# Patient Record
Sex: Female | Born: 1980
Health system: Southern US, Community
[De-identification: ages and names within clinical notes are randomized; demographics above are authoritative.]

## PROBLEM LIST (undated history)

## (undated) DIAGNOSIS — Z789 Other specified health status: Secondary | ICD-10-CM

## (undated) DIAGNOSIS — K5792 Diverticulitis of intestine, part unspecified, without perforation or abscess without bleeding: Secondary | ICD-10-CM

## (undated) DIAGNOSIS — N6009 Solitary cyst of unspecified breast: Secondary | ICD-10-CM

## (undated) HISTORY — PX: WISDOM TOOTH EXTRACTION: SHX21

## (undated) HISTORY — PX: CERVICAL CERCLAGE: SHX1329

## (undated) HISTORY — DX: Solitary cyst of unspecified breast: N60.09

## (undated) HISTORY — DX: Other specified health status: Z78.9

---

## 2001-06-05 ENCOUNTER — Emergency Department (HOSPITAL_COMMUNITY): Admission: EM | Admit: 2001-06-05 | Discharge: 2001-06-05 | Payer: Self-pay | Admitting: *Deleted

## 2002-04-18 ENCOUNTER — Encounter: Payer: Self-pay | Admitting: *Deleted

## 2002-04-18 ENCOUNTER — Inpatient Hospital Stay (HOSPITAL_COMMUNITY): Admission: AD | Admit: 2002-04-18 | Discharge: 2002-04-22 | Payer: Self-pay | Admitting: *Deleted

## 2002-04-21 ENCOUNTER — Encounter (INDEPENDENT_AMBULATORY_CARE_PROVIDER_SITE_OTHER): Payer: Self-pay | Admitting: Specialist

## 2002-05-16 ENCOUNTER — Encounter (INDEPENDENT_AMBULATORY_CARE_PROVIDER_SITE_OTHER): Payer: Self-pay

## 2002-05-16 ENCOUNTER — Inpatient Hospital Stay (HOSPITAL_COMMUNITY): Admission: AD | Admit: 2002-05-16 | Discharge: 2002-05-16 | Payer: Self-pay | Admitting: *Deleted

## 2005-08-26 ENCOUNTER — Other Ambulatory Visit: Admission: RE | Admit: 2005-08-26 | Discharge: 2005-08-26 | Payer: Self-pay | Admitting: Obstetrics & Gynecology

## 2008-08-09 ENCOUNTER — Inpatient Hospital Stay (HOSPITAL_COMMUNITY): Admission: AD | Admit: 2008-08-09 | Discharge: 2008-08-10 | Payer: Self-pay | Admitting: Obstetrics and Gynecology

## 2008-08-10 IMAGING — US US OB COMP LESS 14 WK
1 series · 13 of 28 positions shown · non-contrast
Comparison: none

CLINICAL DATA: Early pregnancy, bleeding, pain

OBSTETRIC <14 WK US AND TRANSVAGINAL OB US
TECHNIQUE: Both transabdominal and transvaginal ultrasound
examinations were performed for complete evaluation of the
gestation as well as the maternal uterus, adnexal regions, and
pelvic cul-de-sac.

[Series 1: us ob comp less 14 wks · 13 of 33 slices shown]
[im 2/33]
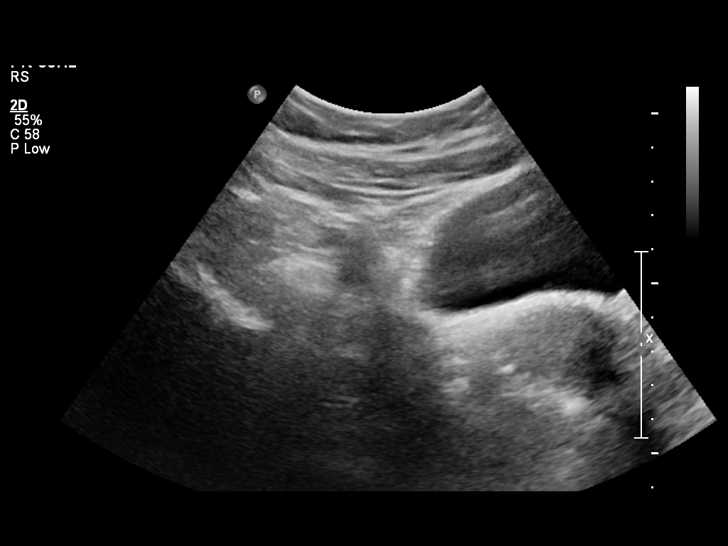
[im 4/33]
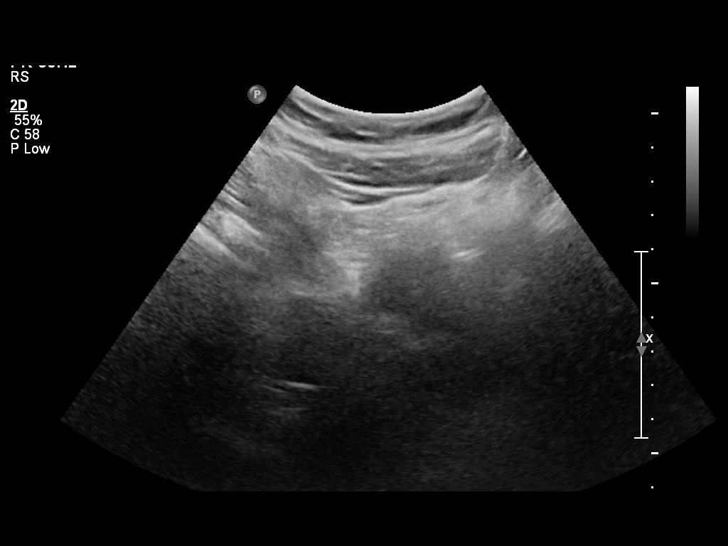
[im 6/33]
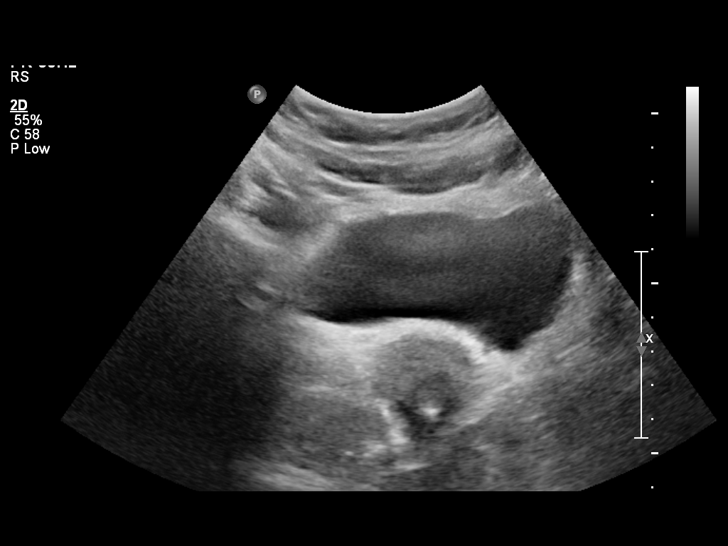
[im 9/33]
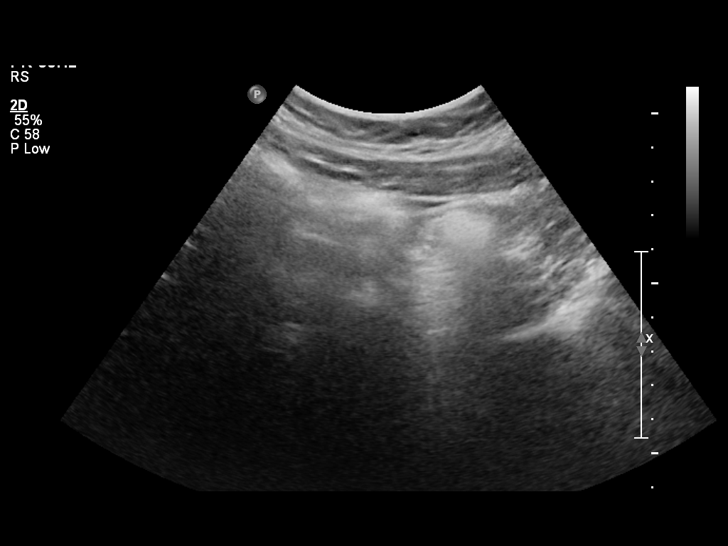
[im 11/33]
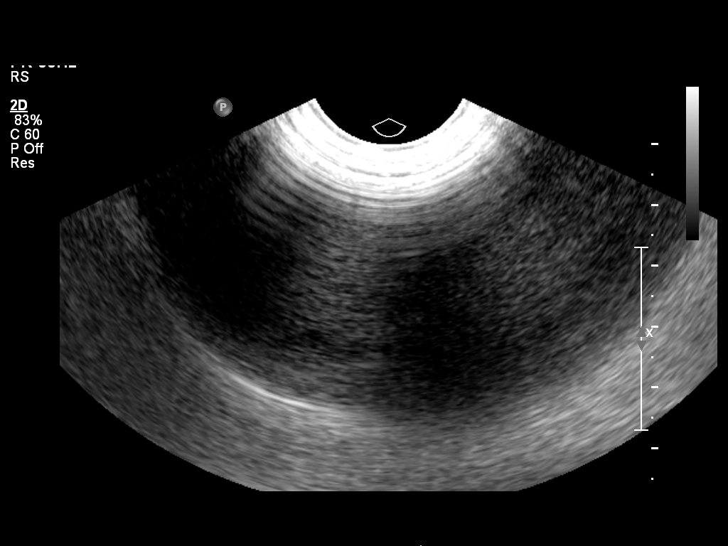
[im 14/33]
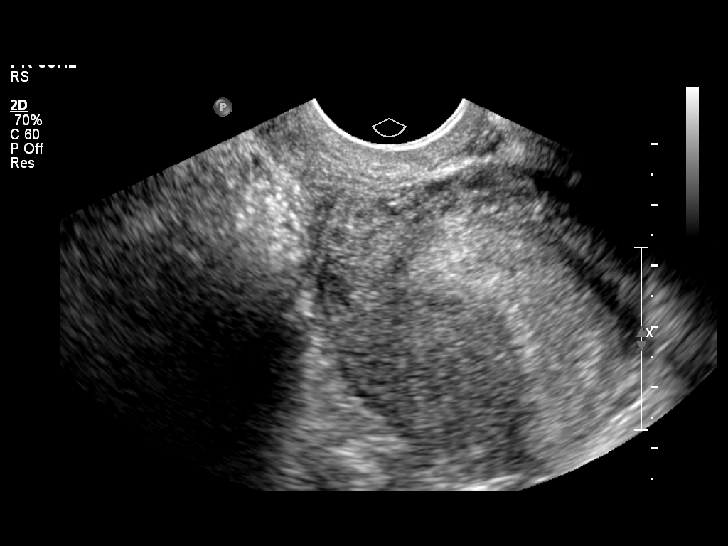
[im 17/33]
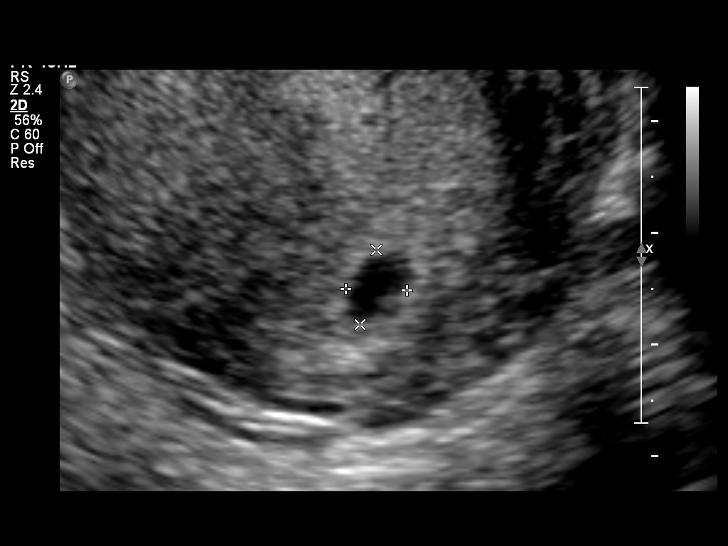
[im 19/33]
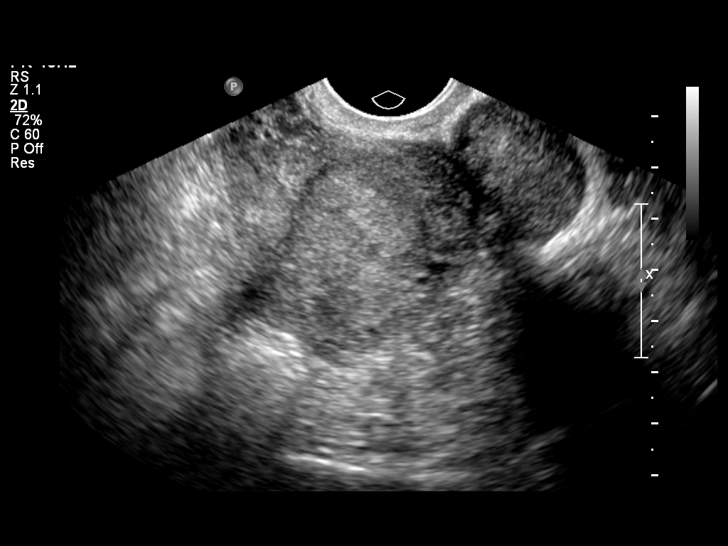
[im 22/33]
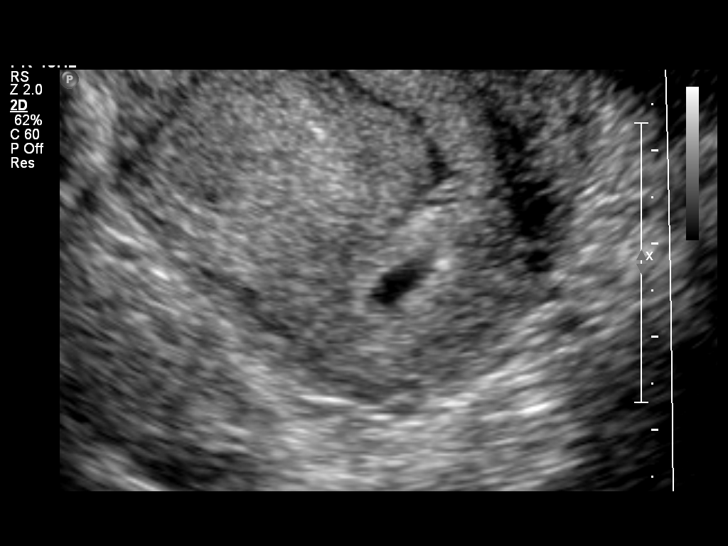
[im 24/33]
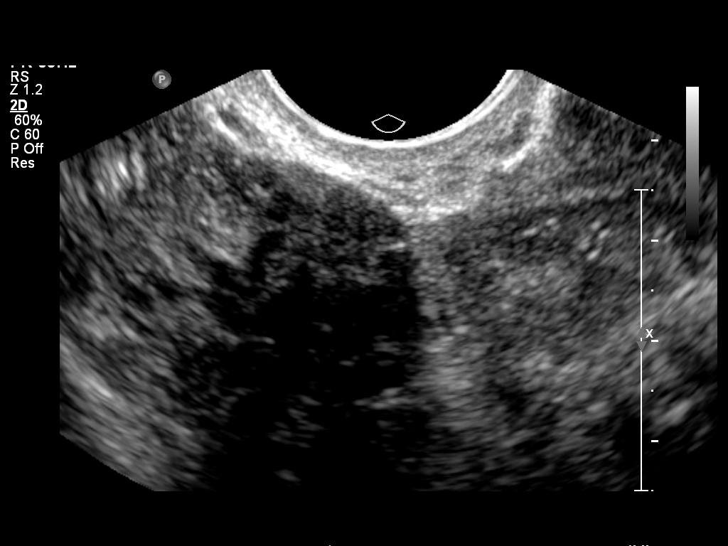
[im 27/33]
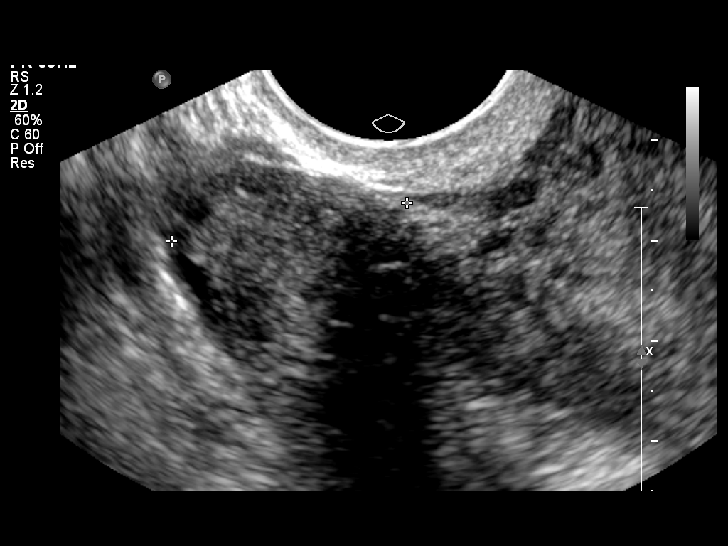
[im 29/33]
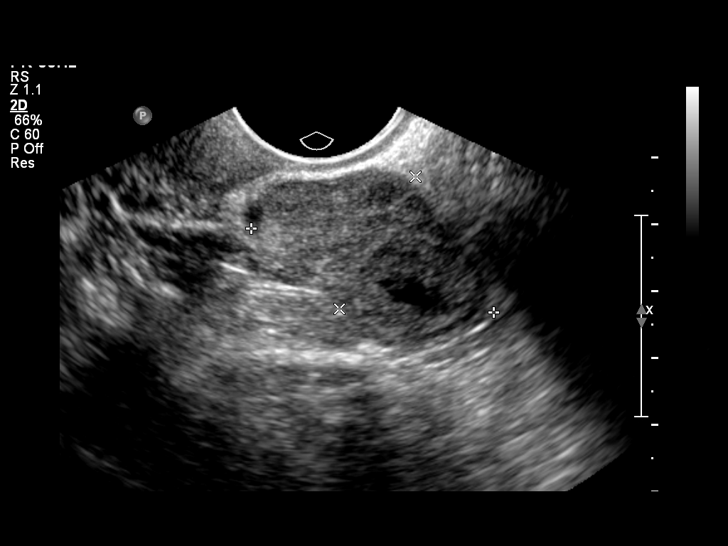
[im 31/33]
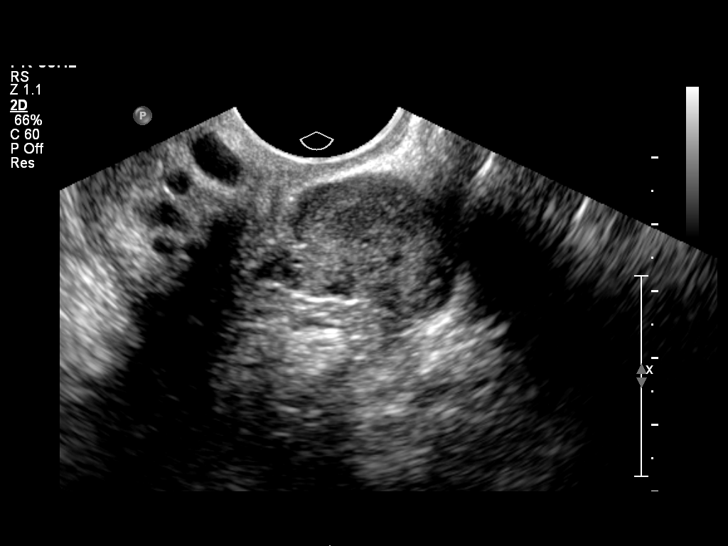

[13 of 28 positions shown; findings below may reference images not displayed]

No prior exams for comparison
Quantitative beta HCG [2A]

Intrauterine gestational sac: Present
Yolk sac: Not identified
Embryo: Not identified
Cardiac Activity: Not identified
Heart Rate: N/A bpm

MSD: 5.7 mm  mm  5    w 1    d
CRL:               w     d          US EDC:

Maternal uterus/adnexae:
No subchorionic hemorrhage.
Tiny sac within uterus without additional uterine mass.
No free pelvic fluid.
Left ovary 3.8 x 2.3 x 2.4 cm, containing corpus luteal cyst.
Right ovary normal size and morphology, 2.7 x 2.2 x 2.4 cm.
No adnexal masses.
IMPRESSION: Apparent early gestational sac within uterus, sac diameter
corresponding to 5 weeks 1 day EGA.
No definite fetal pole or yolk sac are visualized, though would
expect visualization with the observed quantitative beta HCG level.
No ectopic pregnancy or associated signs identified.
Serial quantitative beta HCG and potentially follow-up ultrasound
recommended to confirm this represents a viable early intrauterine
pregnancy and to definitively exclude ectopic pregnancy.

## 2008-08-14 ENCOUNTER — Inpatient Hospital Stay (HOSPITAL_COMMUNITY): Admission: RE | Admit: 2008-08-14 | Discharge: 2008-08-14 | Payer: Self-pay | Admitting: Obstetrics and Gynecology

## 2008-08-14 IMAGING — US US OB TRANSVAGINAL
1 series · 14 of 19 positions shown · non-contrast
Comparison: none

OBSTETRICAL ULTRASOUND:
 This ultrasound exam was performed in the [HOSPITAL] Ultrasound Department.  The OB US report was generated in the AS system, and faxed to the ordering physician.  This report is also available in [REDACTED] PACS.

[Series 1: us ob transvaginal · 14 of 19 slices shown]
[im 1/19]
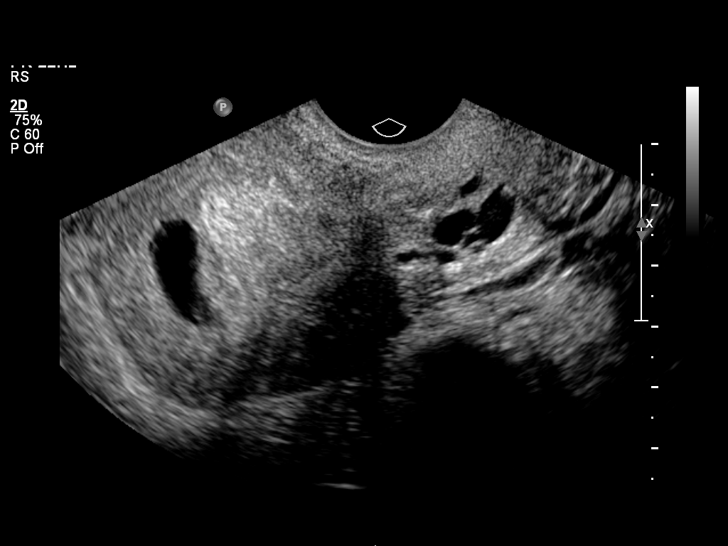
[im 3/19]
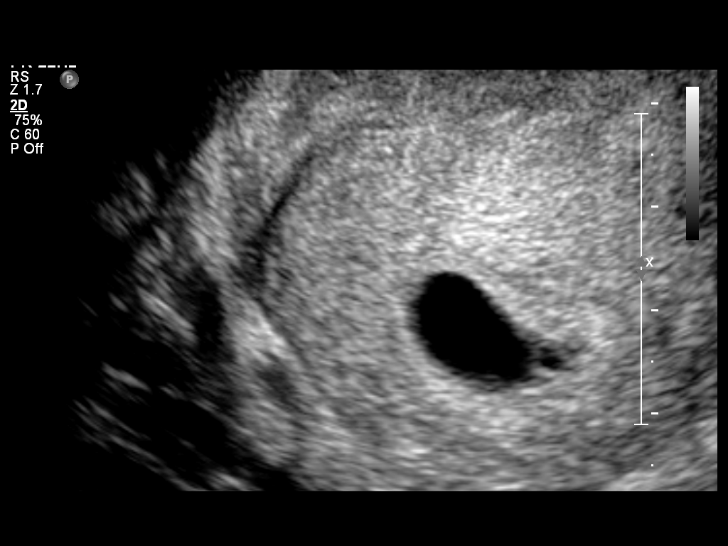
[im 4/19]
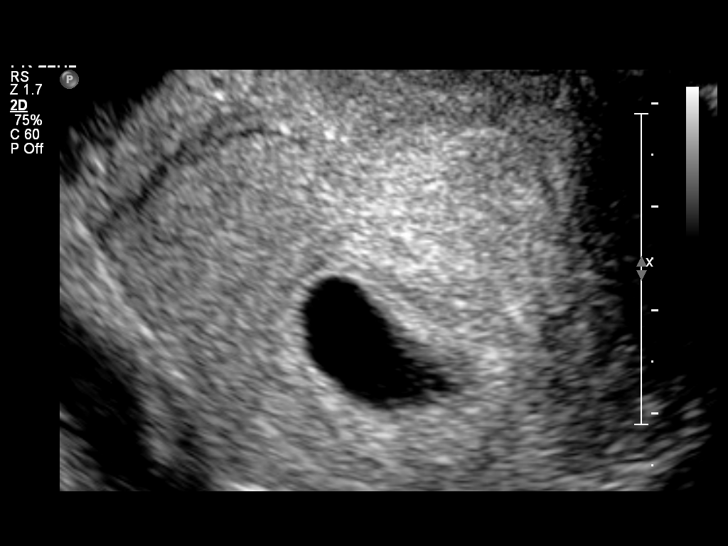
[im 5/19]
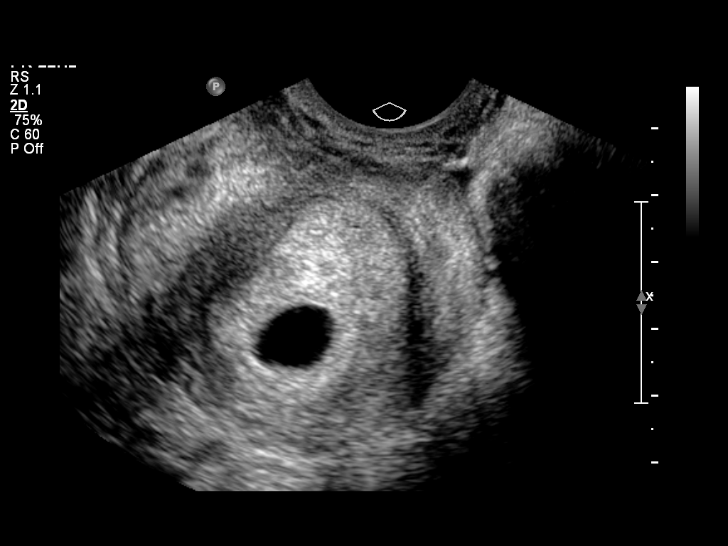
[im 7/19]
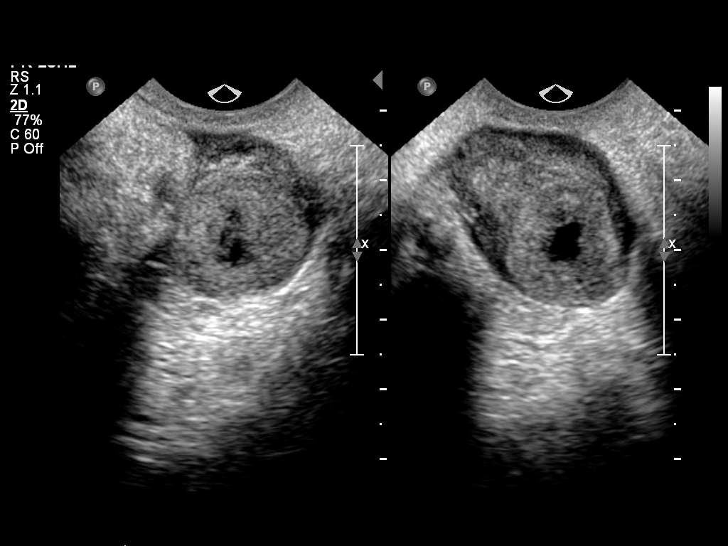
[im 8/19]
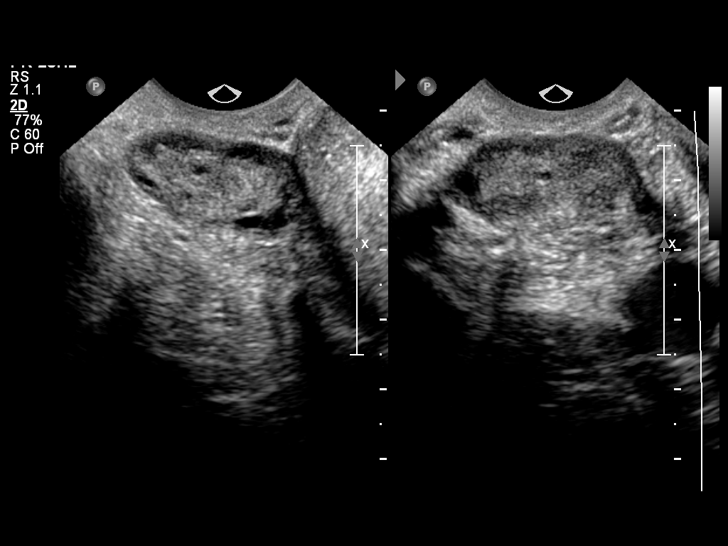
[im 9/19]
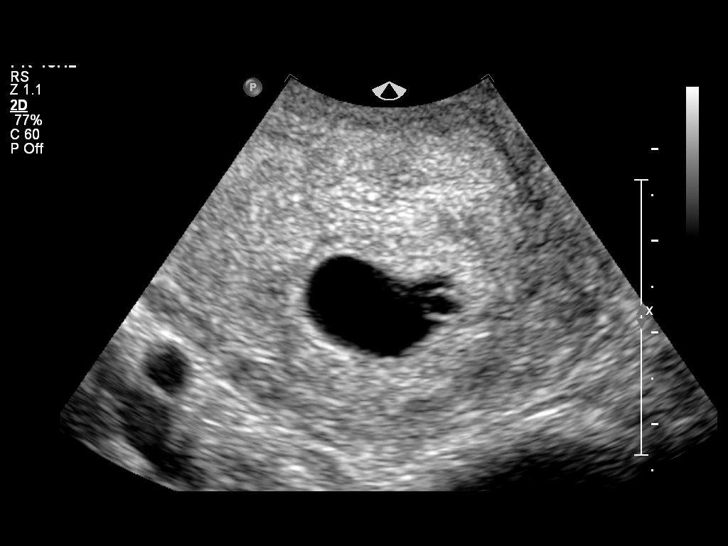
[im 11/19]
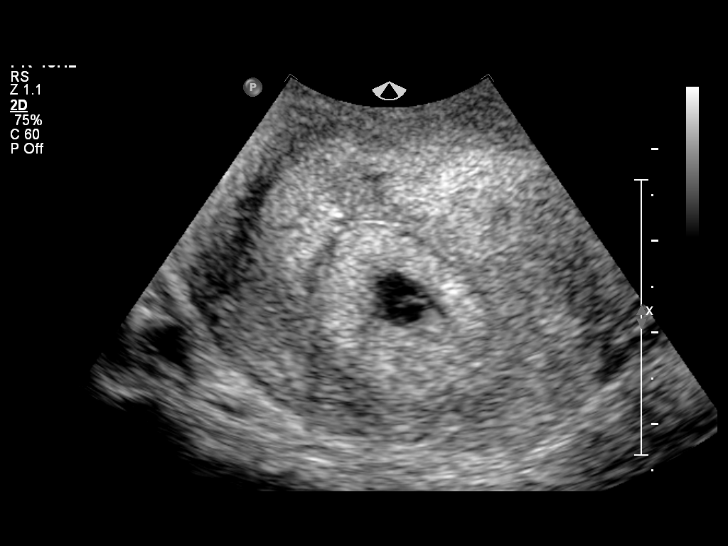
[im 12/19]
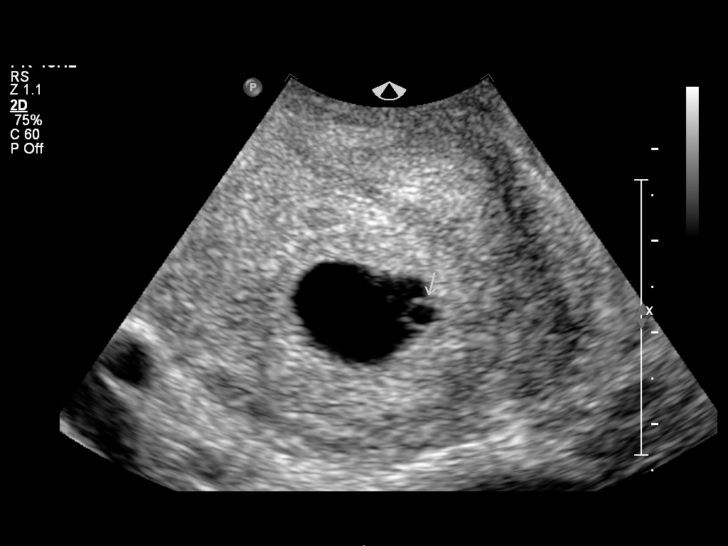
[im 13/19]
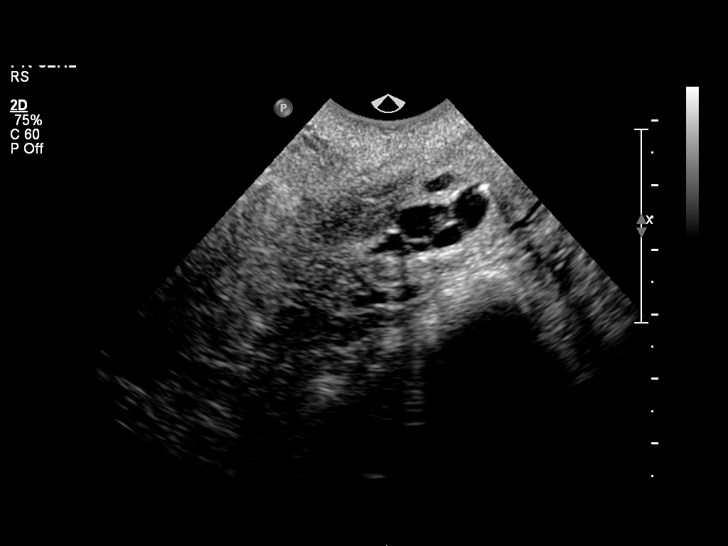
[im 15/19]
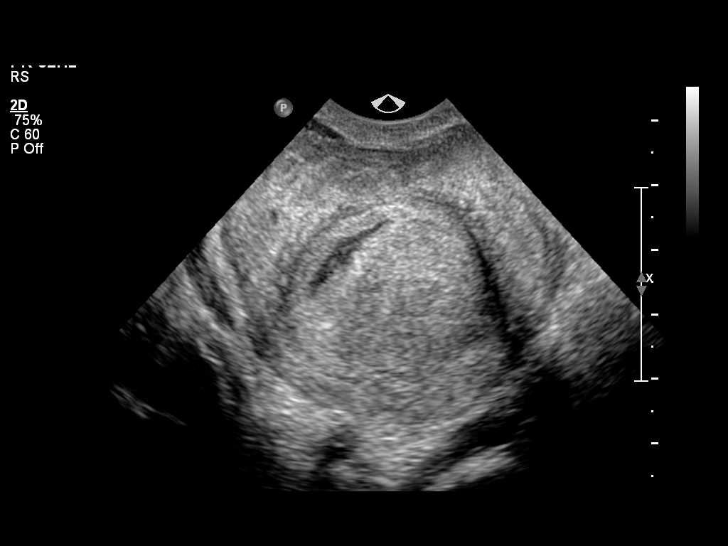
[im 16/19]
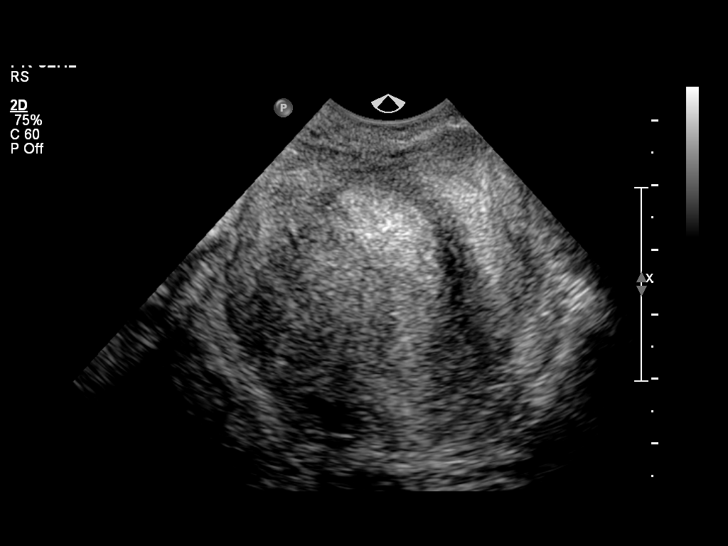
[im 17/19]
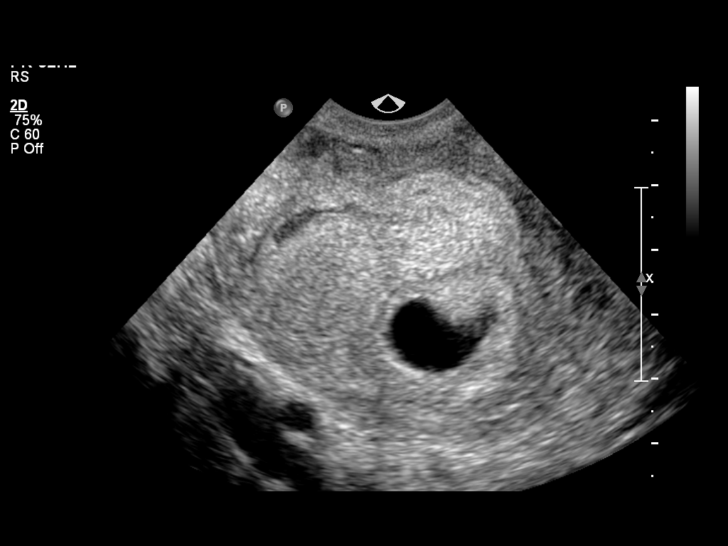
[im 19/19]
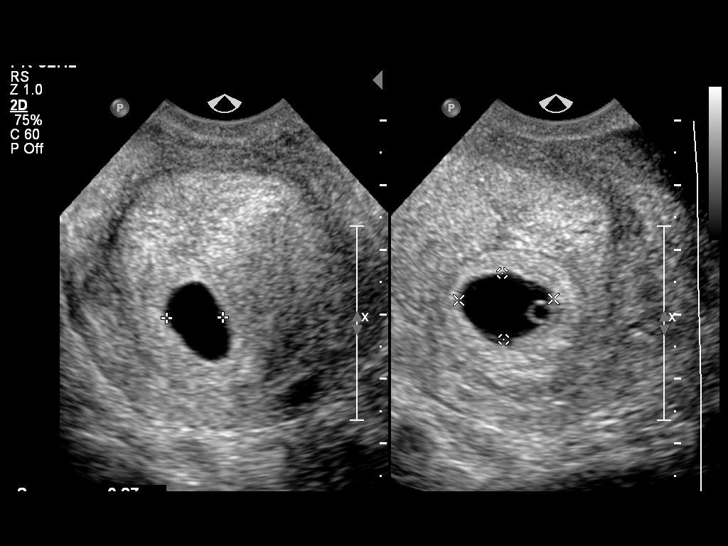

[14 of 19 positions shown; findings below may reference images not displayed]

IMPRESSION: See AS Obstetric US report.

## 2008-10-26 ENCOUNTER — Inpatient Hospital Stay (HOSPITAL_COMMUNITY): Admission: AD | Admit: 2008-10-26 | Discharge: 2008-10-26 | Payer: Self-pay | Admitting: Obstetrics and Gynecology

## 2008-10-26 IMAGING — US US OB COMP +14 WK
1 series · 14 of 28 positions shown · non-contrast
Comparison: none

OBSTETRICAL ULTRASOUND:
 This ultrasound exam was performed in the [HOSPITAL] Ultrasound Department.  The OB US report was generated in the AS system, and faxed to the ordering physician.  This report is also available in [REDACTED] PACS.

[Series 1: us ob comp +14 wk · 14 of 40 slices shown]
[im 2/40]
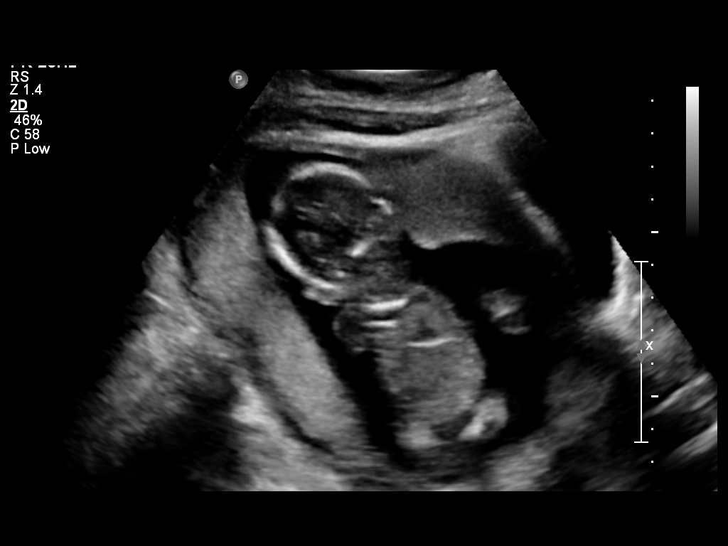
[im 5/40]
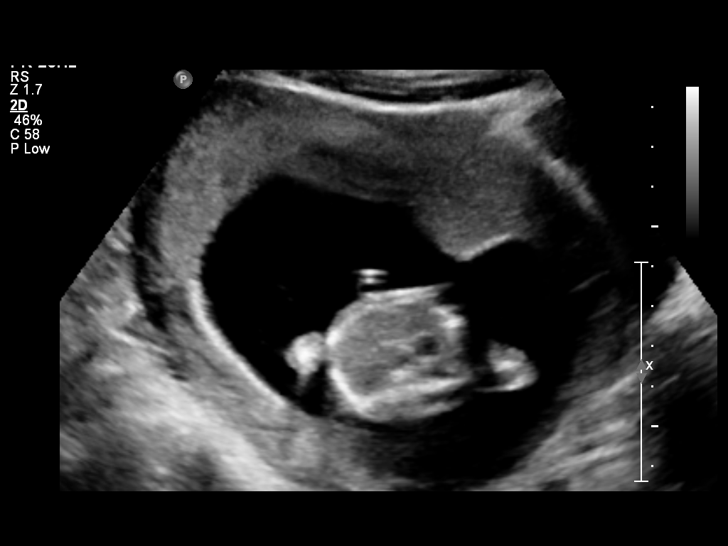
[im 8/40]
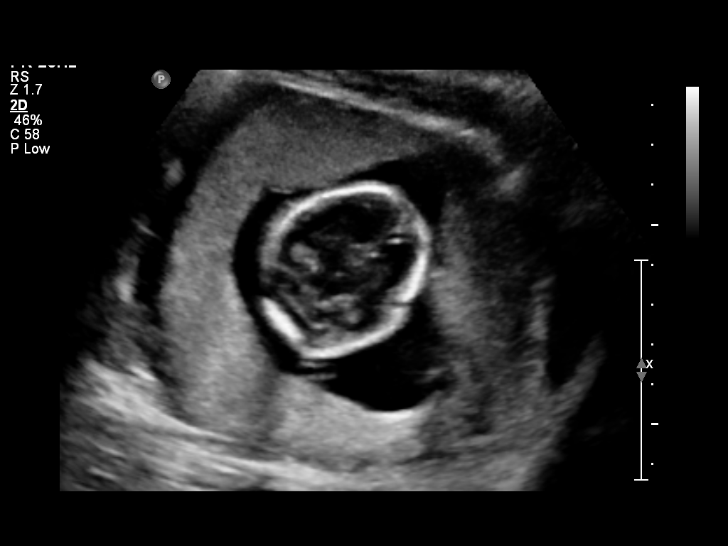
[im 11/40]
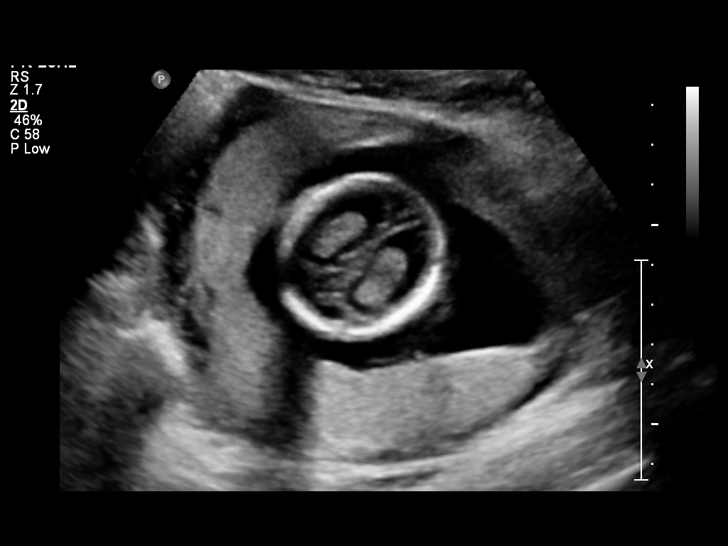
[im 14/40]
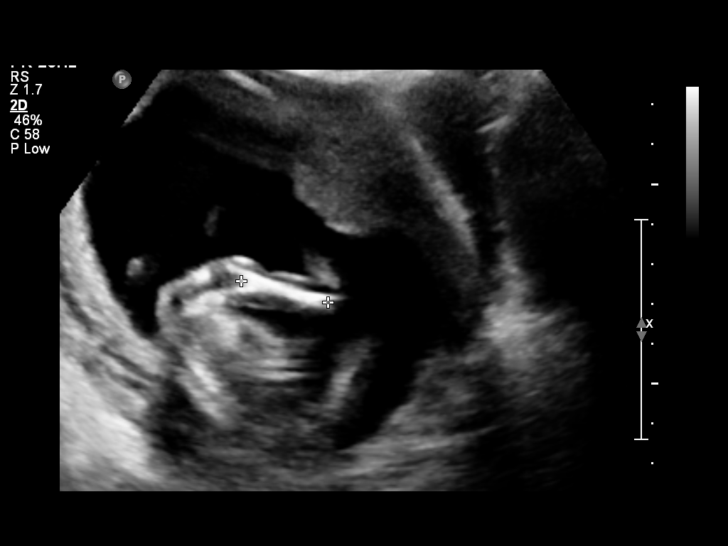
[im 16/40]
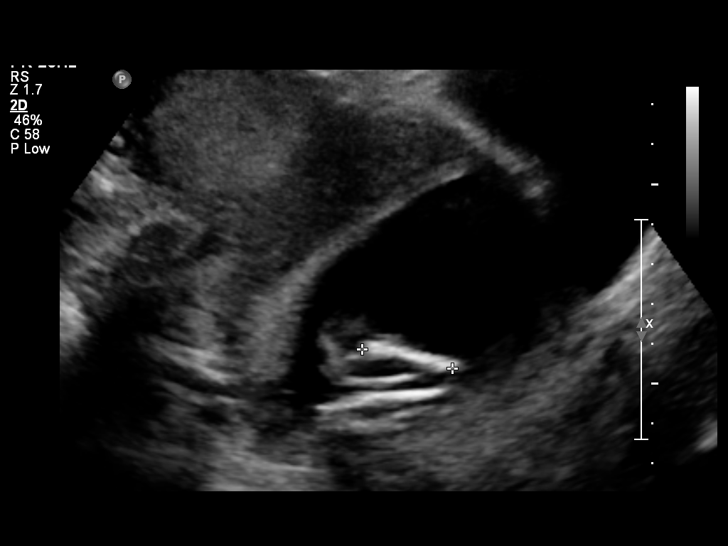
[im 19/40]
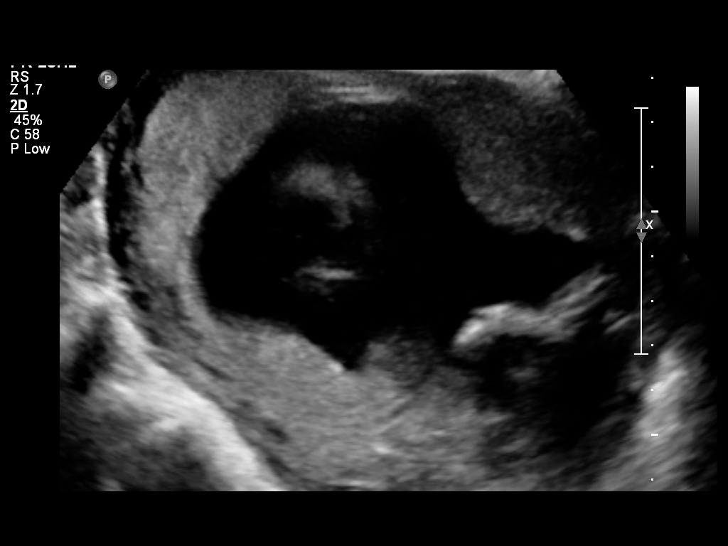
[im 22/40]
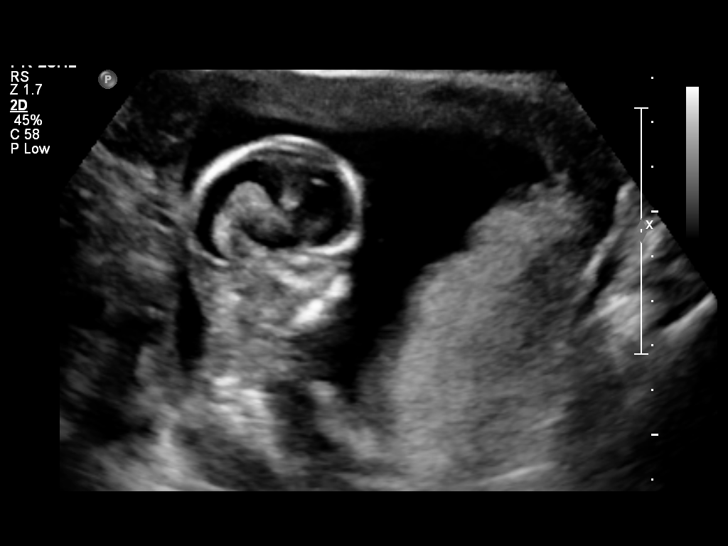
[im 25/40]
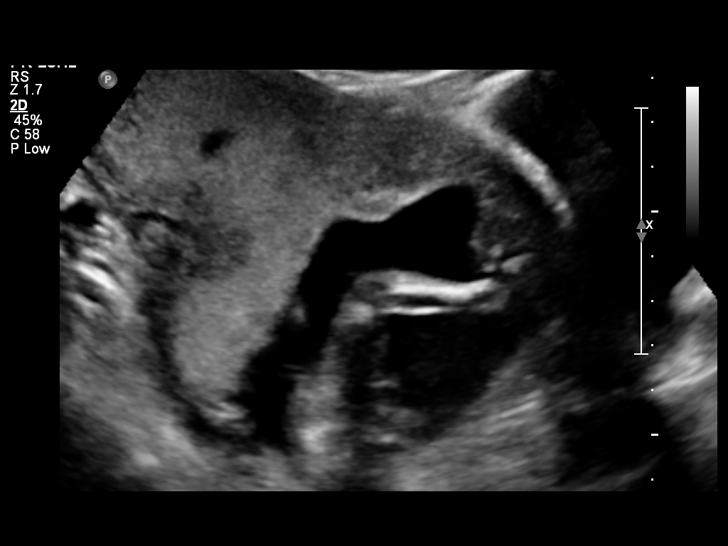
[im 28/40]
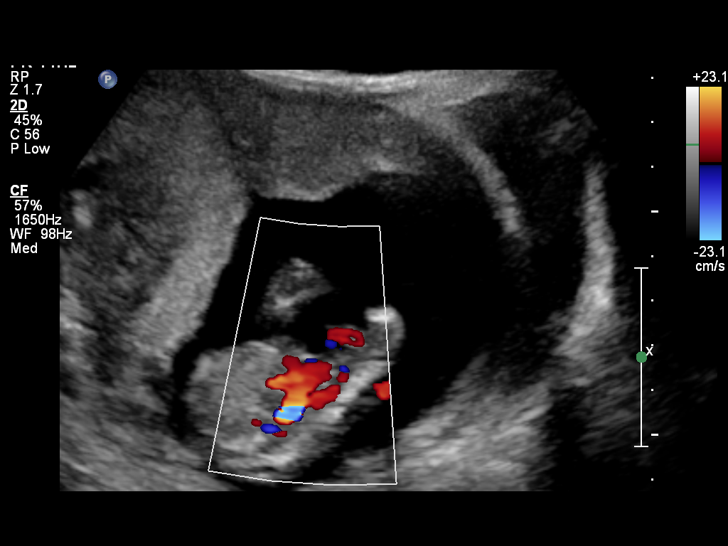
[im 31/40]
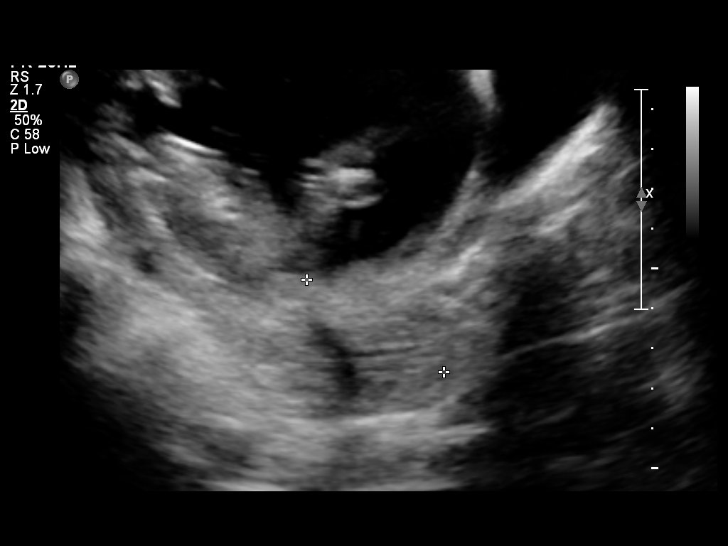
[im 34/40]
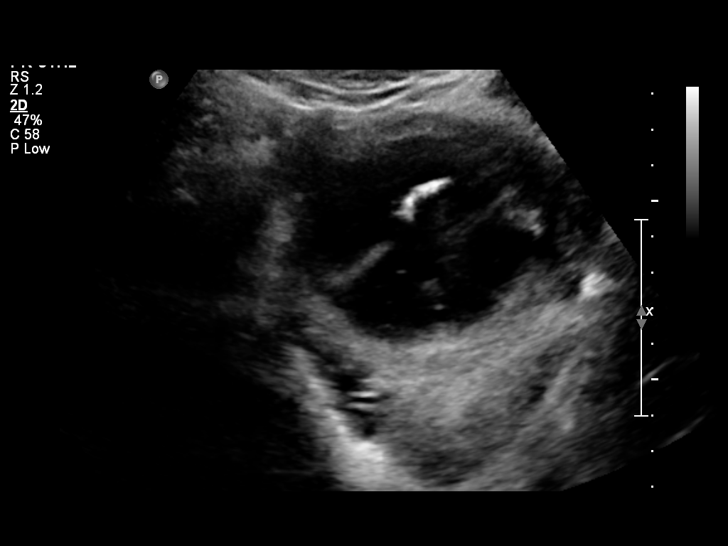
[im 37/40]
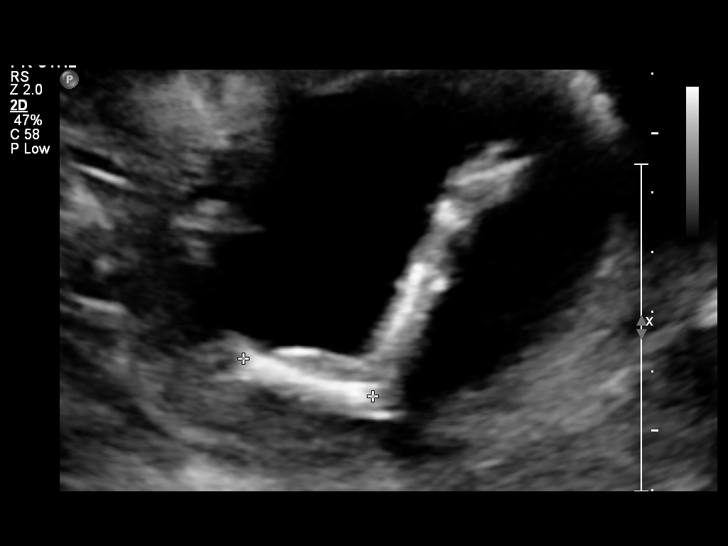
[im 40/40]
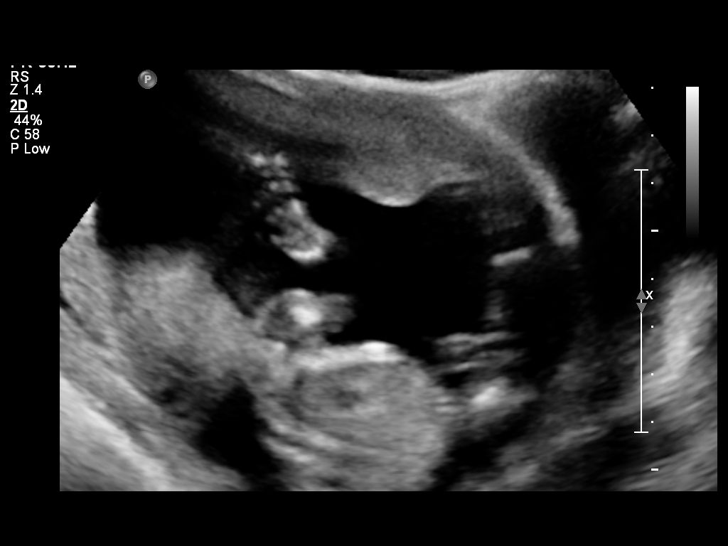

[14 of 28 positions shown; findings below may reference images not displayed]

IMPRESSION: See AS Obstetric US report.

## 2008-12-31 ENCOUNTER — Inpatient Hospital Stay (HOSPITAL_COMMUNITY): Admission: AD | Admit: 2008-12-31 | Discharge: 2008-12-31 | Payer: Self-pay | Admitting: Obstetrics and Gynecology

## 2009-02-05 ENCOUNTER — Inpatient Hospital Stay (HOSPITAL_COMMUNITY): Admission: AD | Admit: 2009-02-05 | Discharge: 2009-02-05 | Payer: Self-pay | Admitting: Obstetrics & Gynecology

## 2009-03-30 ENCOUNTER — Inpatient Hospital Stay (HOSPITAL_COMMUNITY): Admission: AD | Admit: 2009-03-30 | Discharge: 2009-04-01 | Payer: Self-pay | Admitting: Obstetrics and Gynecology

## 2009-04-03 ENCOUNTER — Ambulatory Visit: Admission: RE | Admit: 2009-04-03 | Discharge: 2009-04-03 | Payer: Self-pay | Admitting: Obstetrics and Gynecology

## 2009-10-15 ENCOUNTER — Ambulatory Visit: Payer: Self-pay | Admitting: Family

## 2009-10-15 DIAGNOSIS — R519 Headache, unspecified: Secondary | ICD-10-CM | POA: Insufficient documentation

## 2009-10-15 DIAGNOSIS — Z8669 Personal history of other diseases of the nervous system and sense organs: Secondary | ICD-10-CM | POA: Insufficient documentation

## 2009-10-15 DIAGNOSIS — K5289 Other specified noninfective gastroenteritis and colitis: Secondary | ICD-10-CM | POA: Insufficient documentation

## 2009-10-15 DIAGNOSIS — R51 Headache: Secondary | ICD-10-CM | POA: Insufficient documentation

## 2009-10-15 DIAGNOSIS — Z8719 Personal history of other diseases of the digestive system: Secondary | ICD-10-CM | POA: Insufficient documentation

## 2009-10-30 ENCOUNTER — Ambulatory Visit: Payer: Self-pay | Admitting: Family

## 2009-10-30 DIAGNOSIS — E669 Obesity, unspecified: Secondary | ICD-10-CM | POA: Insufficient documentation

## 2009-10-30 DIAGNOSIS — J329 Chronic sinusitis, unspecified: Secondary | ICD-10-CM | POA: Insufficient documentation

## 2009-10-30 LAB — CONVERTED CEMR LAB
ALT: 16 units/L (ref 0–35)
AST: 12 units/L (ref 0–37)
Alkaline Phosphatase: 134 units/L — ABNORMAL HIGH (ref 39–117)
Basophils Relative: 0 % (ref 0–1)
CO2: 24 meq/L (ref 19–32)
Cholesterol: 149 mg/dL (ref 0–200)
LDL Cholesterol: 86 mg/dL (ref 0–99)
MCHC: 32.9 g/dL (ref 30.0–36.0)
Monocytes Relative: 6 % (ref 3–12)
Neutro Abs: 4.4 10*3/uL (ref 1.7–7.7)
Neutrophils Relative %: 54 % (ref 43–77)
Platelets: 307 10*3/uL (ref 150–400)
RBC: 4.54 M/uL (ref 3.87–5.11)
Sodium: 139 meq/L (ref 135–145)
TSH: 0.608 microintl units/mL (ref 0.350–4.500)
Total Bilirubin: 0.8 mg/dL (ref 0.3–1.2)
Total CHOL/HDL Ratio: 2.9
Total Protein: 7.8 g/dL (ref 6.0–8.3)
VLDL: 12 mg/dL (ref 0–40)
WBC: 8.2 10*3/uL (ref 4.0–10.5)

## 2009-11-03 ENCOUNTER — Encounter: Payer: Self-pay | Admitting: Family

## 2010-01-01 ENCOUNTER — Encounter: Payer: Self-pay | Admitting: Family

## 2010-05-16 ENCOUNTER — Ambulatory Visit (HOSPITAL_COMMUNITY): Admission: RE | Admit: 2010-05-16 | Discharge: 2010-05-16 | Payer: Self-pay | Admitting: Obstetrics and Gynecology

## 2010-05-20 DEATH — deceased

## 2010-08-10 ENCOUNTER — Encounter: Payer: Self-pay | Admitting: Obstetrics and Gynecology

## 2010-08-19 NOTE — Assessment & Plan Note (Signed)
Summary: cpx/kdc   Vital Signs:  Patient profile:   30 year old female Height:      67 inches Weight:      194.50 pounds BMI:     30.57 Temp:     98.2 degrees F oral Pulse rate:   78 / minute Pulse rhythm:   regular Resp:     16 per minute BP sitting:   102 / 64  (right arm) Cuff size:   large  Vitals Entered By: Mervin Kung CMA (October 30, 2009 11:11 AM) CC: room 5  Pt is here for complete physical. Is Patient Diabetic? No   CC:  room 5  Pt is here for complete physical..  History of Present Illness: [name] is here for a complete physical exam. Patient reports regular exercise (Zumba) several times a week  Notes that she has a 68 month old son.  She is back to her pre-pregnancy weight. Does not smoke.  Patient had abnormal Pap 11/10- had colposcopy ([facility]).    Headaches- notes that HA's are rare, not bothering her.  hx Diverticulitis-  Never hospitalized for diverticulitis.  Preventive Screening-Counseling & Management  Alcohol-Tobacco     Alcohol drinks/day: <1     Alcohol type: wine     Smoking Status: never  Caffeine-Diet-Exercise     Caffeine use/day: 1 glass tea daily  Allergies (verified): No Known Drug Allergies  Past History:  Past Surgical History: Wisdom teeth extraction--2005 Colonoscopy 2009 Dr Jason Fila  Colposcopy 2010  Family History: Arthritis--maternal grandmother Colon cancer--maternal grandfather Prostate cancer--maternal grandfather Stroke--maternal grandfather HTN--mother,father, maternal grandfather Diabetes--maternal grandfather  Mom- HTN Dad-HTN 2 sisters- younger, alive and well  Social History: Married Never Smoked Alcohol use-yes (once a month) Regular exercise-yes 16 month old son 03/30/09 Caffeine use/day:  1 glass tea daily  Review of Systems       Constitutional: Denies Fever ENT:  Denies nasal congestion due to allergies, eye itching.  Flonase and allegra - not helping(yellow nasal discharge for  several days) Resp: Denies cough CV:  Denies Chest Pain GI:  Denies nausea or vomitting GU: Denies dysuria Lymphatic: Denies lymphadenopathy Musculoskeletal:  + joint pain in both thumbs- sees orthopedist [facility] Skin:  Denies Rashes or lesions Psychiatric: Denies depression or anxiety Neuro: Denies numbness      Physical Exam  General:  Well-developed,well-nourished,in no acute distress; alert,appropriate and cooperative throughout examination Head:  [place]/AT Eyes:  PERRLA Ears:  External ear exam shows no significant lesions or deformities.  Otoscopic examination reveals clear canals, tympanic membranes are intact bilaterally without bulging, retraction, inflammation or discharge. Hearing is grossly normal bilaterally. Mouth:  Oral mucosa and oropharynx without lesions or exudates.  Teeth in good repair. Neck:  No deformities, masses, or tenderness noted. Breasts:  No mass, nodules, thickening, tenderness, bulging, retraction, inflamation, nipple discharge or skin changes noted.   Lungs:  Normal respiratory effort, chest expands symmetrically. Lungs are clear to auscultation, no crackles or wheezes. Heart:  Normal rate and regular rhythm. S1 and S2 normal without gallop, murmur, click, rub or other extra sounds. Abdomen:  Bowel sounds positive,abdomen soft and non-tender without masses, organomegaly or hernias noted. Msk:  No deformity or scoliosis noted of thoracic or lumbar spine.   Neurologic:  alert & oriented X3, cranial nerves II-XII intact, and gait normal.   Skin:  Intact without suspicious lesions or rashes Cervical Nodes:  No lymphadenopathy noted Psych:  Cognition and judgment appear intact. Alert and cooperative with normal attention span  and concentration. No apparent delusions, illusions, hallucinations   Impression & Recommendations:  Problem # 1:  Preventive Health Care (ICD-V70.0) Assessment Comment Only Patient counselled on diet, exercise, weight loss.   Immunizations reviewed and up to date.  Pap is up to date - followed by GYN.  Check fasting lab work.   Orders: T-Comprehensive Metabolic Panel [phone]) T-Lipid Profile [phone]) T-CBC w/Diff ([id number]) T-TSH [id number])  Problem # 2:  OBESITY (ICD-278.00) Assessment: Unchanged Patient will be referred to nutrition for assistance with weight loss.   Orders: Nutrition Referral (Nutrition)  Problem # 3:  SINUSITIS (ICD-473.9) Assessment: New Nasal D/C has recently changed from clear to yellow.  Will treat with amoxicillin (pt instructed to use condoms as back up on OCP).  Continue flonase and allegra.   Her updated medication list for this problem includes:    Flonase 50 Mcg/act Susp (Fluticasone propionate) .Marland Kitchen... 1 spray each nostril once daily as needed.    Amoxicillin 500 Mg Cap (Amoxicillin) .Marland Kitchen... Take 1 capsule by mouth three times a day x 10 days  Complete Medication List: 1)  Birth Control Pills  .... Take 1 tablet by mouth once a day 2)  Flonase 50 Mcg/act Susp (Fluticasone propionate) .Marland Kitchen.. 1 spray each nostril once daily as needed. 3)  Zyrtec Allergy 10 Mg Tabs (Cetirizine hcl) .... Take 1 tablet by mouth once a day 4)  Amoxicillin 500 Mg Cap (Amoxicillin) .... Take 1 capsule by mouth three times a day x 10 days  Patient Instructions: 1)  You will be called about your referral to nutrition. 2)  Have a nice spring! Prescriptions: AMOXICILLIN 500 MG CAP (AMOXICILLIN) Take 1 capsule by mouth three times a day X 10 days  #30 x 0   Entered and Authorized by:   Lemont Fillers FNP   Signed by:   Lemont Fillers FNP on 10/30/2009   Method used:   Electronically to        [facility] [phone] (retail)       [address]       [place], [place]  [address]       Ph: [phone]       Fax: [phone]   RxID:   [id number]     Current Allergies (reviewed today): No known allergies

## 2010-08-19 NOTE — Letter (Signed)
Summary: Out of Work  [facility] [employer]  [address] [place], [place] [address]   Phone: [phone]  Fax: [phone]    October 15, 2009   Employee:  Victoria [name]    To Whom It May Concern:   For Medical reasons, please excuse the above named employee from work for the following dates:  Start:   3/29  End:   4/1 if feeling better  If you need additional information, please feel free to contact our office.         Sincerely,    Lemont Fillers FNP

## 2010-08-19 NOTE — Letter (Signed)
   [place] at [facility] [address] [place], [place]  [address]  [place] Phone: [phone]      November 03, 2009   [facility] BRIDGES [address] [place], [place] [address]  RE:  LAB RESULTS  Dear  Ms. BRIDGES,  The following is an interpretation of your most recent lab tests.  Please take note of any instructions provided or changes to medications that have resulted from your lab work.  ELECTROLYTES:  Good - no changes needed  KIDNEY FUNCTION TESTS:  Good - no changes needed  LIVER FUNCTION TESTS:  Good - no changes needed  LIPID PANEL:  Good - no changes needed Triglyceride: 59   Cholesterol: 149   LDL: 86   HDL: 51   Chol/HDL%:  2.9 Ratio  THYROID STUDIES:  Thyroid studies normal TSH: 0.608     CBC:  Good - no changes needed   Sincerely Yours,    Lemont Fillers FNP

## 2010-08-19 NOTE — Letter (Signed)
Summary: Patient Cancellation/[facility]  Patient Cancellation/[facility]   Imported By: Lanelle Bal 02/03/2010 08:09:01  _____________________________________________________________________  External Attachment:    Type:   Image     Comment:   External Document

## 2010-08-19 NOTE — Assessment & Plan Note (Signed)
Summary: NEW TO BE EST STOMACH PAIN/[facility]   Vital Signs:  Patient profile:   30 year old female Height:      67 inches Weight:      196 pounds BMI:     30.81 Temp:     97.5 degrees F oral Pulse rate:   84 / minute Pulse rhythm:   regular Resp:     12 per minute BP sitting:   90 / 62  (left arm) Cuff size:   large  Vitals Entered By: Mervin Kung CMA (October 15, 2009 11:13 AM) CC: room 17  Abdominal pain,  diarrhea, nausea since yesterday. Has hx. of diverticulitis but notes this doesn't feel the same. Is Patient Diabetic? No   CC:  room 17  Abdominal pain, diarrhea, and nausea since yesterday. Has hx. of diverticulitis but notes this doesn't feel the same.Marland Kitchen  History of Present Illness: [name] is a 30 year old female who presents today to establish care.  She reports lower abdominal pain x 24 hours.  Notes + diarrhea, denies blood in stool.  + nausea and vomitting.  Notes mom had recent GI virus on sunday.  Patient notes 6-7 loose stools a day.  Preventive Screening-Counseling & Management  Alcohol-Tobacco     Alcohol drinks/day: <1     Smoking Status: never  Caffeine-Diet-Exercise     Caffeine use/day: 1 glass daily     Does Patient Exercise: yes     Type of exercise: Zumba     Exercise (avg: min/session): 30-60     Times/week: 3  Allergies (verified): No Known Drug Allergies  Past History:  Past Medical History: Chicken pox Diverticulitis--2009 Frequent Headaches Tested for HIV--2010  Past Surgical History: Wisdom teeth extraction--2005  Family History: Arthritis--maternal grandmother Colon cancer--maternal grandfather Prostate cancer--maternal grandfather Stroke--maternal grandfather HTN--mother,father, maternal grandfather Diabetes--maternal grandfather  Social History: Married Never Smoked Alcohol use-yes Regular exercise-yes 93 month old son Smoking Status:  never Caffeine use/day:  1 glass daily Does Patient Exercise:  yes  Physical  Exam  General:  Well-developed,well-nourished,in no acute distress; alert,appropriate and cooperative throughout examination Head:  Normocephalic and atraumatic without obvious abnormalities. No apparent alopecia or balding. Lungs:  Normal respiratory effort, chest expands symmetrically. Lungs are clear to auscultation, no crackles or wheezes. Heart:  Normal rate and regular rhythm. S1 and S2 normal without gallop, murmur, click, rub or other extra sounds. Abdomen:  Bowel sounds positive,abdomen soft and non-tender without masses, organomegaly or hernias noted.   Impression & Recommendations:  Problem # 1:  GASTROENTERITIS, ACUTE (ICD-558.9) Assessment Comment Only Will plan to treat with conservative measures.  I recommended as needed immodium, gatorade.  Pt instructed to call if worsening abdominal pain, if symptoms worsen or if they do not improve.  Will plan for full CPX next visit with fasting labs.  Complete Medication List: 1)  Birth Control Pills  .... Take 1 tablet by mouth once a day  Patient Instructions: 1)  Nature conservation officer at [employer] 2)  [address] 3)  [place], [place] [address] 4)  [phone] 5)  Please call if fever, worsening abdominal pain, if symptoms worsen or if they do not improve.  6)  Please follow up in 2 weeks for a complete physical 7)  Drink as much fluid as you can tolerate for the next few days.  Current Allergies (reviewed today): No known allergies      Preventive Care Screening  Pap Smear:    Date:  06/11/2009    Results:  abnormal

## 2010-09-16 ENCOUNTER — Inpatient Hospital Stay (HOSPITAL_COMMUNITY)
Admission: AD | Admit: 2010-09-16 | Discharge: 2010-09-16 | Disposition: A | Discharge status: Home / Self Care | Payer: BC Managed Care – PPO | Source: Ambulatory Visit | Attending: Obstetrics and Gynecology | Admitting: Obstetrics and Gynecology

## 2010-09-16 DIAGNOSIS — N898 Other specified noninflammatory disorders of vagina: Secondary | ICD-10-CM | POA: Insufficient documentation

## 2010-09-16 DIAGNOSIS — O99891 Other specified diseases and conditions complicating pregnancy: Secondary | ICD-10-CM | POA: Insufficient documentation

## 2010-09-16 LAB — CBC
Platelets: 233 10*3/uL (ref 150–400)
RBC: 4.15 MIL/uL (ref 3.87–5.11)
RDW: 13.4 % (ref 11.5–15.5)
WBC: 10 10*3/uL (ref 4.0–10.5)

## 2010-09-16 LAB — WET PREP, GENITAL
Trich, Wet Prep: NONE SEEN
Yeast Wet Prep HPF POC: NONE SEEN

## 2010-09-16 LAB — ABO/RH: ABO/RH(D): B POS

## 2010-09-17 LAB — GC/CHLAMYDIA PROBE AMP, GENITAL
Chlamydia, DNA Probe: NEGATIVE
GC Probe Amp, Genital: NEGATIVE

## 2010-10-01 LAB — CBC
Platelets: 286 10*3/uL (ref 150–400)
RBC: 4.32 MIL/uL (ref 3.87–5.11)
RDW: 14.4 % (ref 11.5–15.5)
WBC: 7.4 10*3/uL (ref 4.0–10.5)

## 2010-10-24 LAB — CBC
HCT: 35.8 % — ABNORMAL LOW (ref 36.0–46.0)
Hemoglobin: 10.1 g/dL — ABNORMAL LOW (ref 12.0–15.0)
Hemoglobin: 12 g/dL (ref 12.0–15.0)
MCV: 87.1 fL (ref 78.0–100.0)
RBC: 3.43 MIL/uL — ABNORMAL LOW (ref 3.87–5.11)
RBC: 4.18 MIL/uL (ref 3.87–5.11)
RDW: 15.7 % — ABNORMAL HIGH (ref 11.5–15.5)
WBC: 11.5 10*3/uL — ABNORMAL HIGH (ref 4.0–10.5)
WBC: 8.3 10*3/uL (ref 4.0–10.5)

## 2010-10-26 LAB — URINALYSIS, ROUTINE W REFLEX MICROSCOPIC
Glucose, UA: NEGATIVE mg/dL
Hgb urine dipstick: NEGATIVE
Ketones, ur: NEGATIVE mg/dL
pH: 6 (ref 5.0–8.0)

## 2010-10-26 LAB — FETAL FIBRONECTIN: Fetal Fibronectin: NEGATIVE

## 2010-10-26 LAB — CBC
HCT: 31.9 % — ABNORMAL LOW (ref 36.0–46.0)
Hemoglobin: 10.9 g/dL — ABNORMAL LOW (ref 12.0–15.0)
MCHC: 34.3 g/dL (ref 30.0–36.0)
RBC: 3.74 MIL/uL — ABNORMAL LOW (ref 3.87–5.11)

## 2010-10-27 LAB — WET PREP, GENITAL
Trich, Wet Prep: NONE SEEN
Yeast Wet Prep HPF POC: NONE SEEN

## 2010-10-27 LAB — URINALYSIS, ROUTINE W REFLEX MICROSCOPIC
Glucose, UA: NEGATIVE mg/dL
Hgb urine dipstick: NEGATIVE
Specific Gravity, Urine: 1.01 (ref 1.005–1.030)

## 2010-10-31 ENCOUNTER — Inpatient Hospital Stay (HOSPITAL_COMMUNITY)
Admission: AD | Admit: 2010-10-31 | Discharge: 2010-11-24 | DRG: 375 | Disposition: A | Discharge status: Home / Self Care | Payer: BC Managed Care – PPO | Source: Ambulatory Visit | Attending: Obstetrics and Gynecology | Admitting: Obstetrics and Gynecology

## 2010-10-31 ENCOUNTER — Inpatient Hospital Stay (HOSPITAL_COMMUNITY)
Admission: AD | Admit: 2010-10-31 | Payer: BC Managed Care – PPO | Source: Ambulatory Visit | Admitting: Obstetrics and Gynecology

## 2010-10-31 DIAGNOSIS — O364XX0 Maternal care for intrauterine death, not applicable or unspecified: Principal | ICD-10-CM | POA: Diagnosis not present

## 2010-10-31 DIAGNOSIS — O3430 Maternal care for cervical incompetence, unspecified trimester: Secondary | ICD-10-CM | POA: Diagnosis present

## 2010-10-31 DIAGNOSIS — O3440 Maternal care for other abnormalities of cervix, unspecified trimester: Secondary | ICD-10-CM

## 2010-10-31 LAB — CBC
Platelets: 212 10*3/uL (ref 150–400)
RDW: 14.1 % (ref 11.5–15.5)
WBC: 8.7 10*3/uL (ref 4.0–10.5)

## 2010-11-02 ENCOUNTER — Inpatient Hospital Stay (HOSPITAL_COMMUNITY): Payer: BC Managed Care – PPO

## 2010-11-02 LAB — STREP B DNA PROBE: Strep Group B Ag: NEGATIVE

## 2010-11-02 IMAGING — US US OB TRANSVAGINAL
1 series · 12 of 12 positions shown · non-contrast
Comparison: none

[Series 1: us ob transvaginal · 12 of 12 slices shown]
[im 1/12]
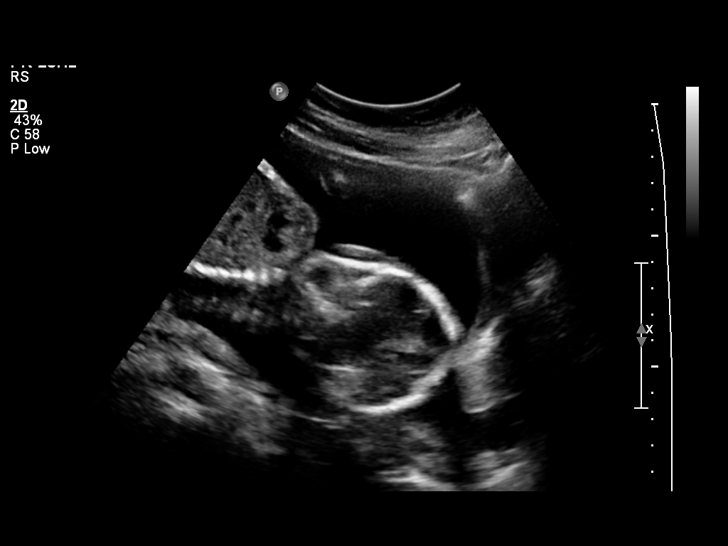
[im 2/12]
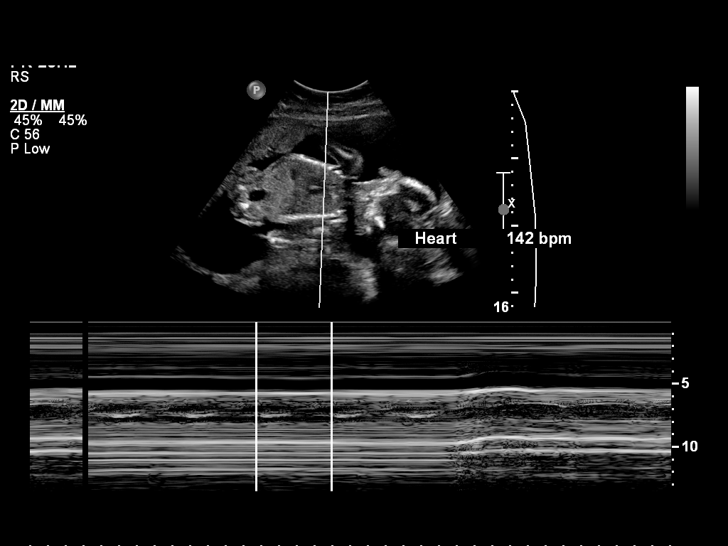
[im 3/12]
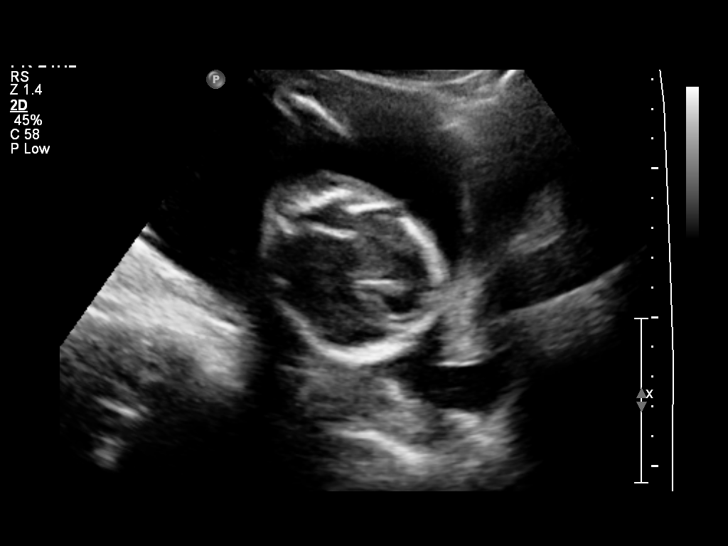
[im 4/12]
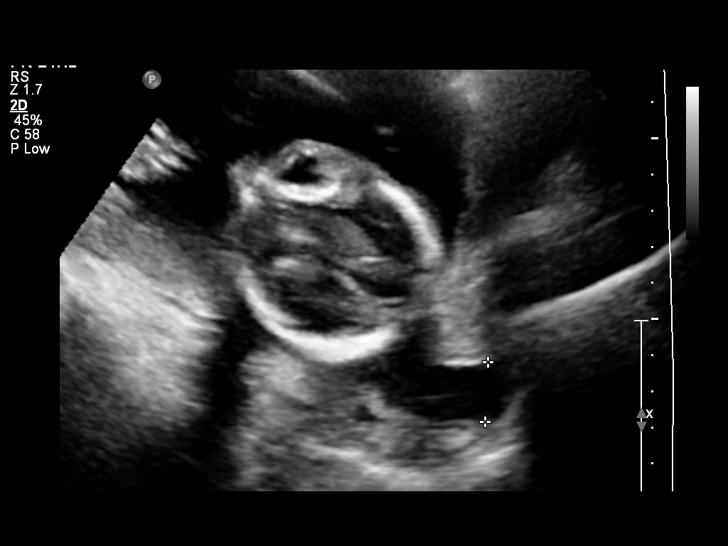
[im 5/12]
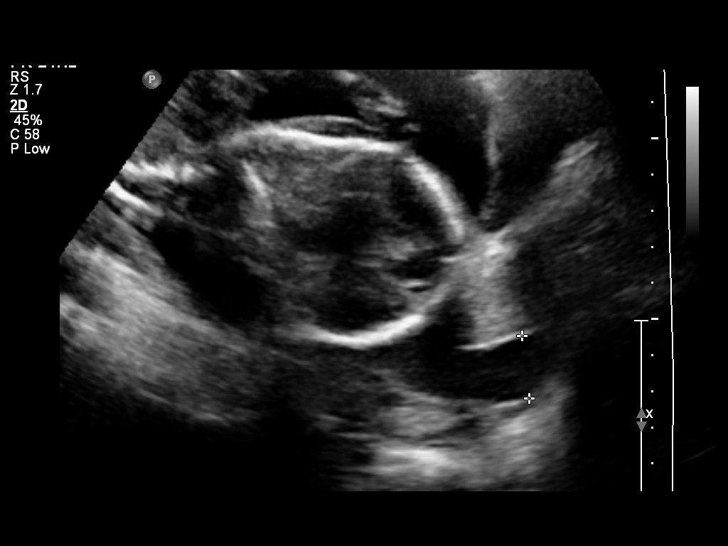
[im 6/12]
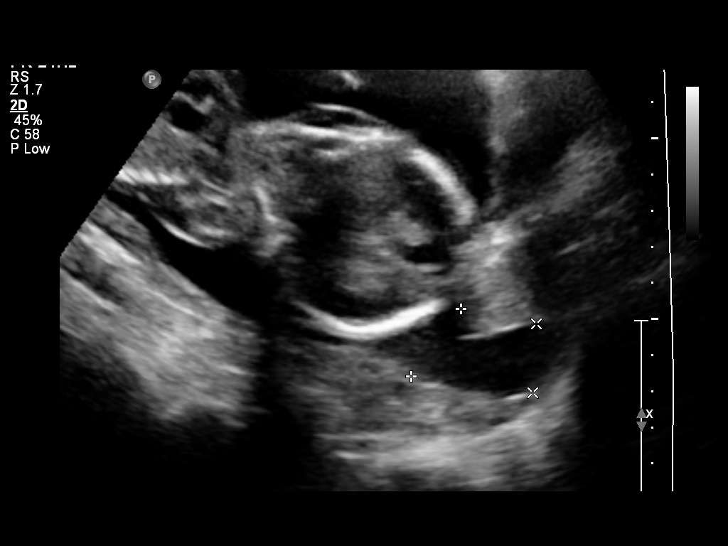
[im 7/12]
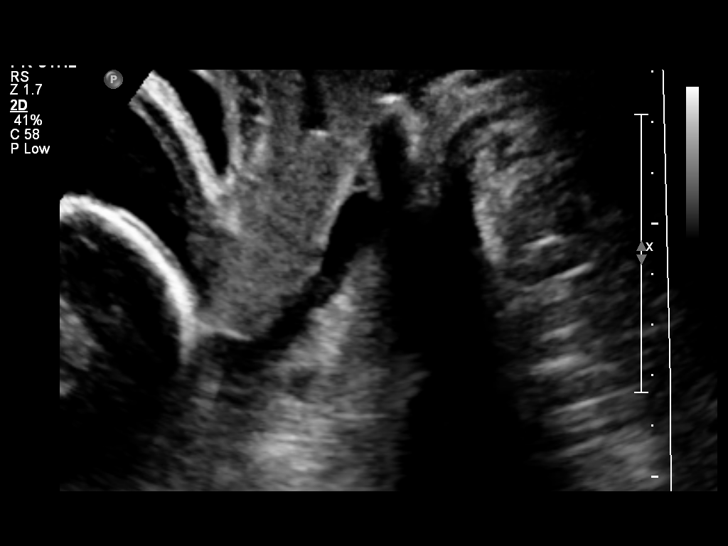
[im 8/12]
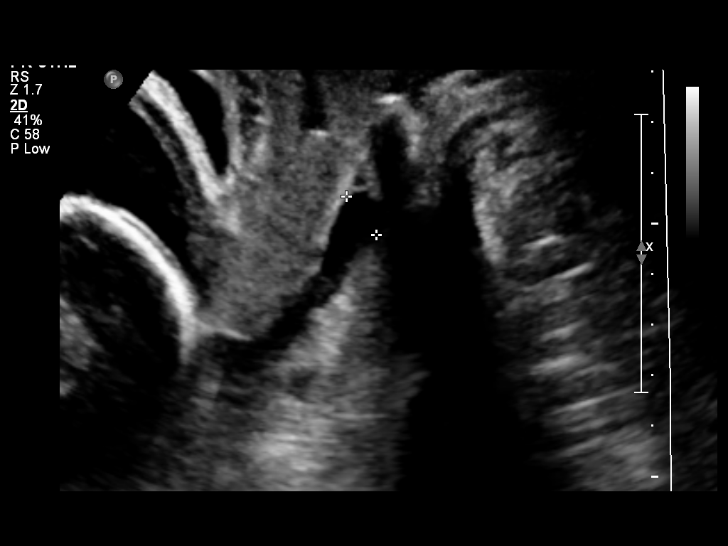
[im 9/12]
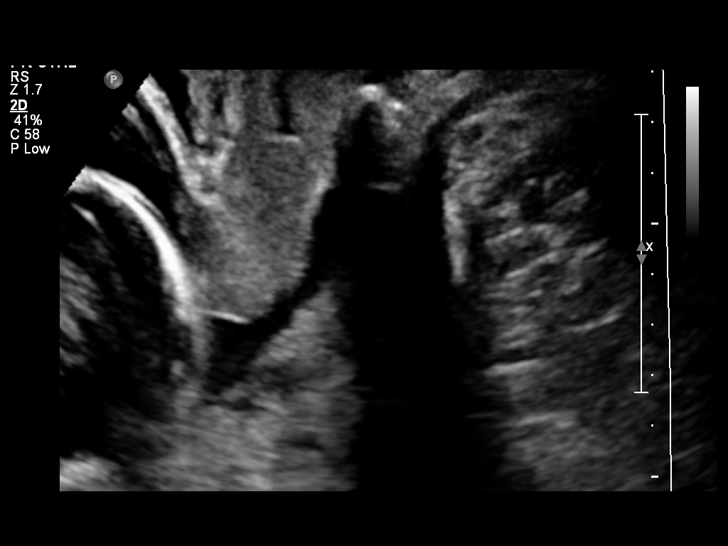
[im 10/12]
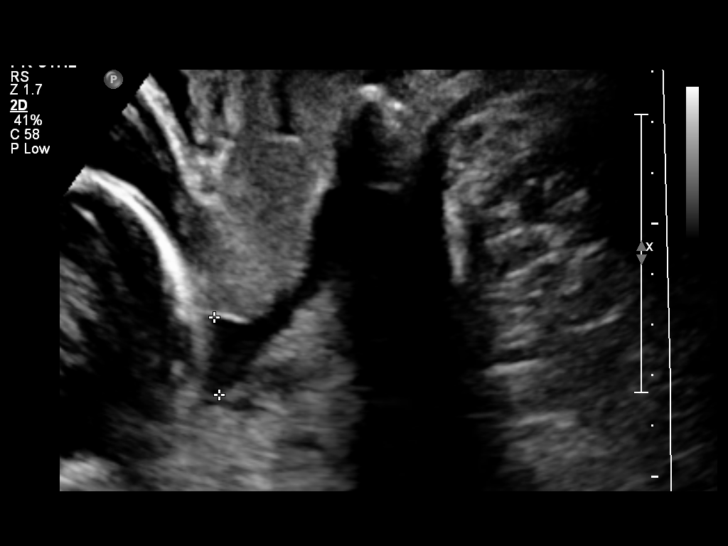
[im 11/12]
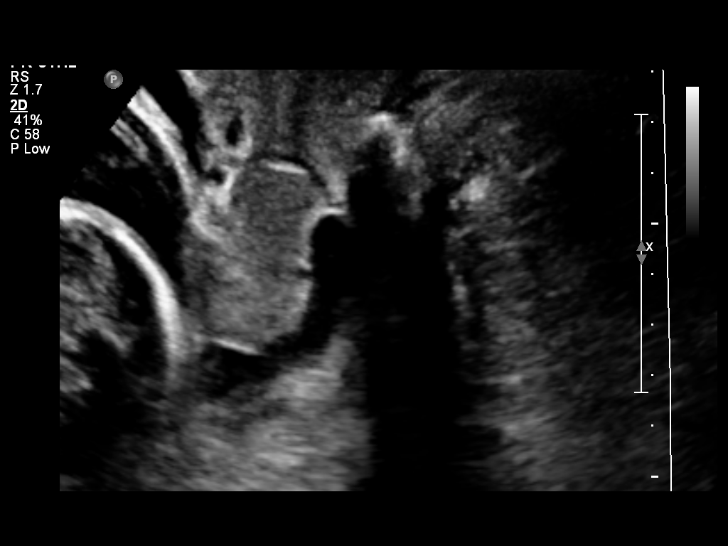
[im 12/12]
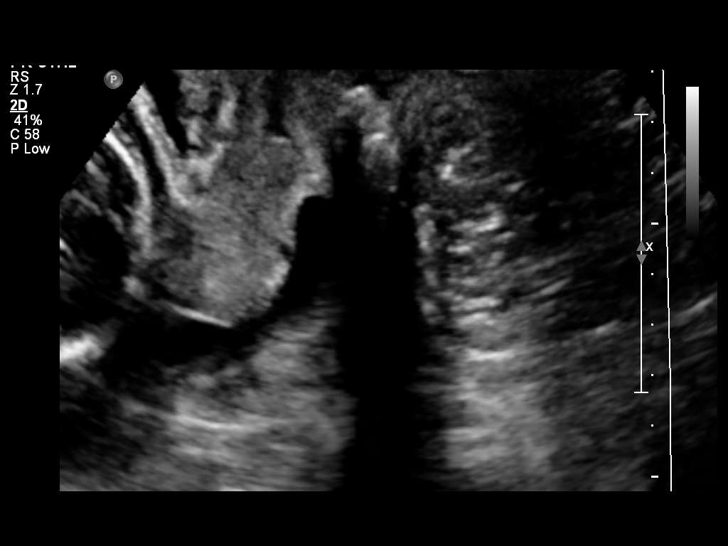

[12 of 12 positions shown; findings below may reference images not displayed]

OBSTETRICS REPORT
                      (Signed Final 11/03/2010 [DATE])

 Order#:         [id number]
Procedures

 US OB TRANSVAGINAL                                    76817.0
Indications

 Cervical insufficiency
 Assess cervical length
Fetal Evaluation

 Fetal Heart Rate:  142                          bpm
 Cardiac Activity:  Observed
 Presentation:      Cephalic

 Amniotic Fluid
 AFI FV:      Subjectively within normal limits
Gestational Age

 Clinical EDD:  20w 1d                                        EDD:   03/21/11
 Best:          20w 1d     Det. By:  Clinical EDD             EDD:   03/21/11
Cervix Uterus Adnexa

 Cervical Length:    0        cm       Funnel Width:   1.7       cm
 Funnel Length:      3.3      cm

 Cervix:       Open.  No closed cervical length.
Impression

 Single living intrauterine fetus in Cephalic presentation.
 Open endocervical canal, with no closed cervical length.

## 2010-11-03 LAB — URINALYSIS, ROUTINE W REFLEX MICROSCOPIC
Bilirubin Urine: NEGATIVE
Protein, ur: NEGATIVE mg/dL
Urobilinogen, UA: 0.2 mg/dL (ref 0.0–1.0)

## 2010-11-03 LAB — URINE MICROSCOPIC-ADD ON: WBC, UA: NONE SEEN WBC/hpf (ref <3)

## 2010-11-03 LAB — ABO/RH: ABO/RH(D): B POS

## 2010-11-03 LAB — HCG, QUANTITATIVE, PREGNANCY: hCG, Beta Chain, Quant, S: 4850 m[IU]/mL — ABNORMAL HIGH (ref <5)

## 2010-11-04 ENCOUNTER — Inpatient Hospital Stay (HOSPITAL_COMMUNITY): Payer: BC Managed Care – PPO

## 2010-11-04 IMAGING — US US OB LIMITED
1 series · 14 of 18 positions shown · non-contrast
Comparison: none

[Series 1: us ob follow up · 18 acquisitions, 14 frames shown]
[im 1/18]
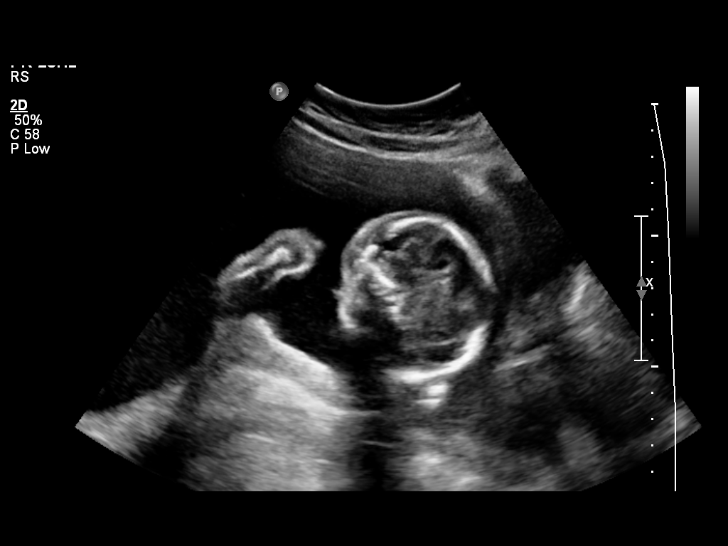
[im 2/18]
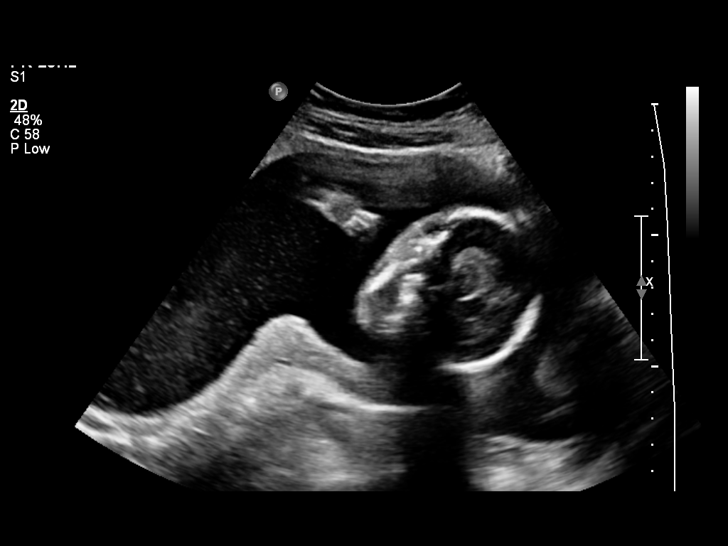
[im 4/18]
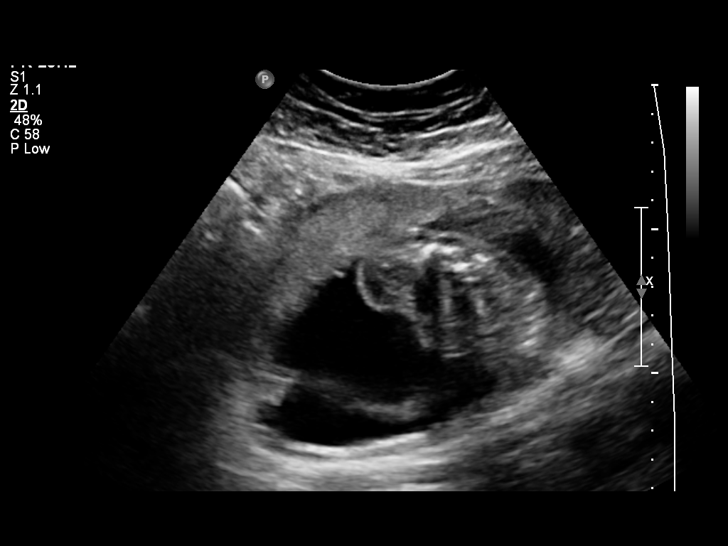
[im 5/18]
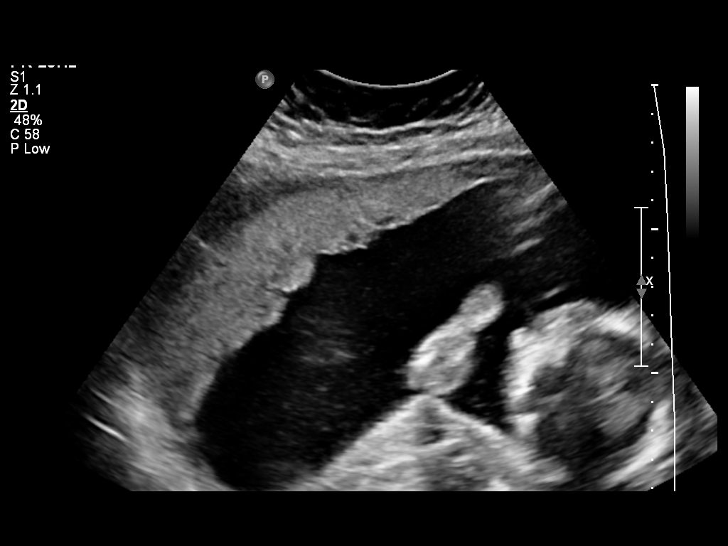
[im 6/18]
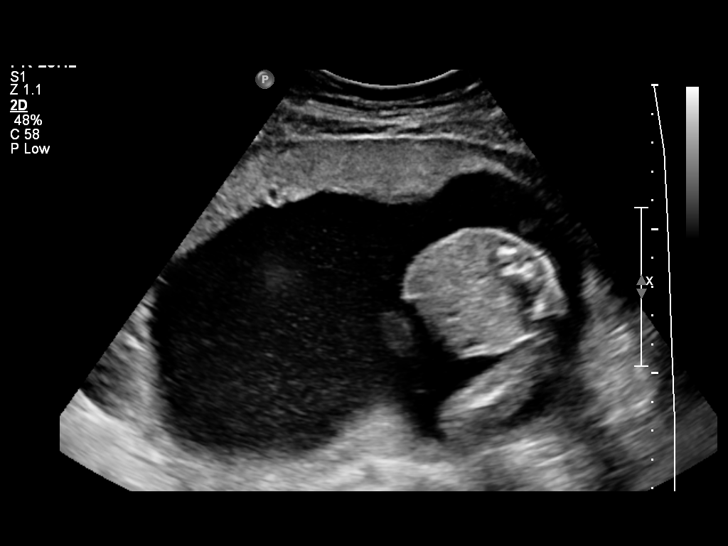
[im 8/18]
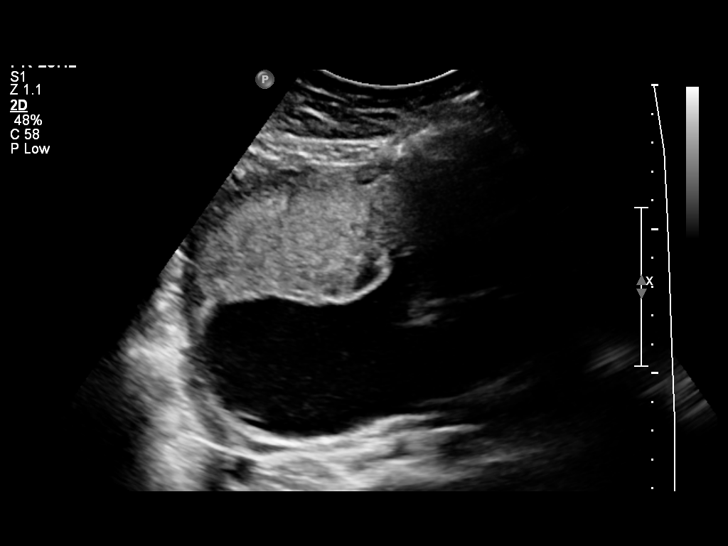
[im 9/18]
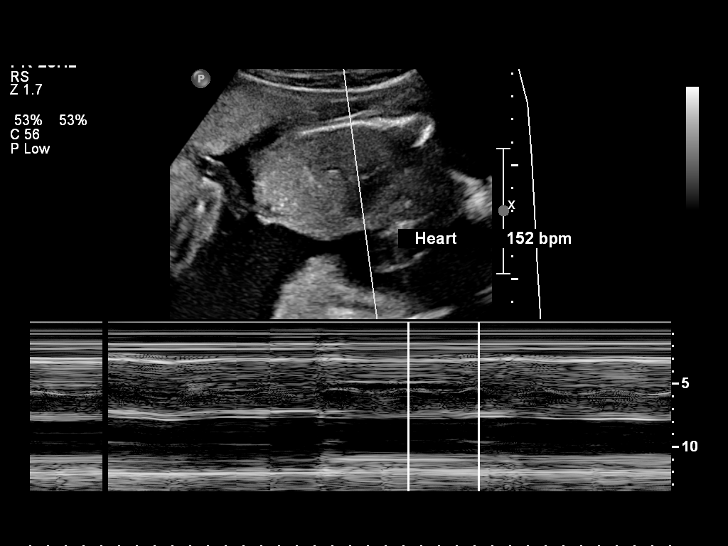
[im 10/18]
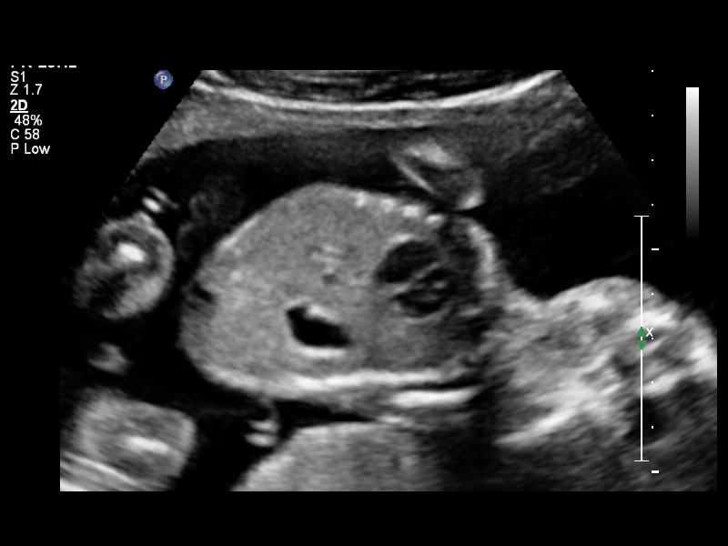
[im 11/18]
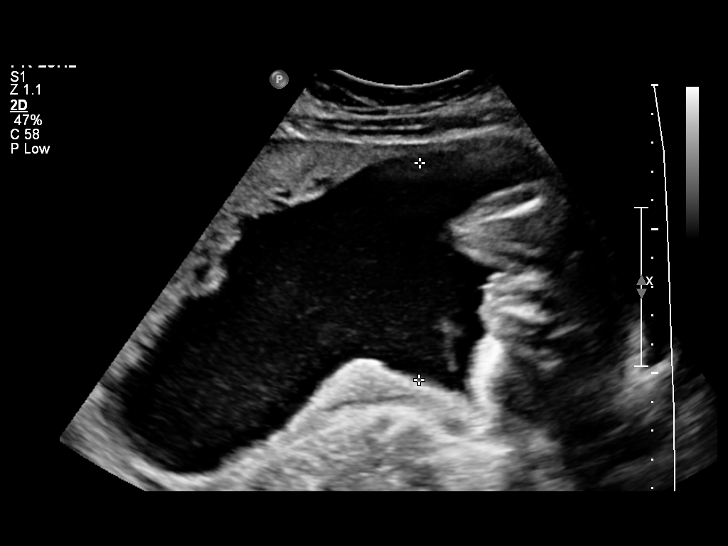
[im 13/18]
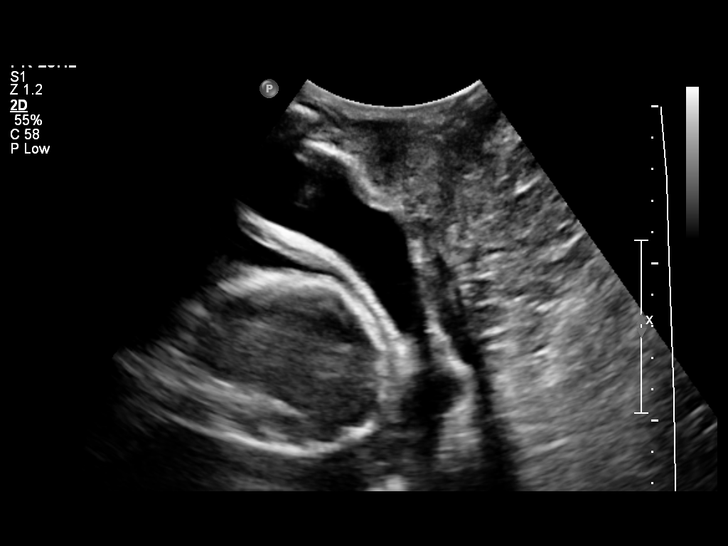
[im 14/18]
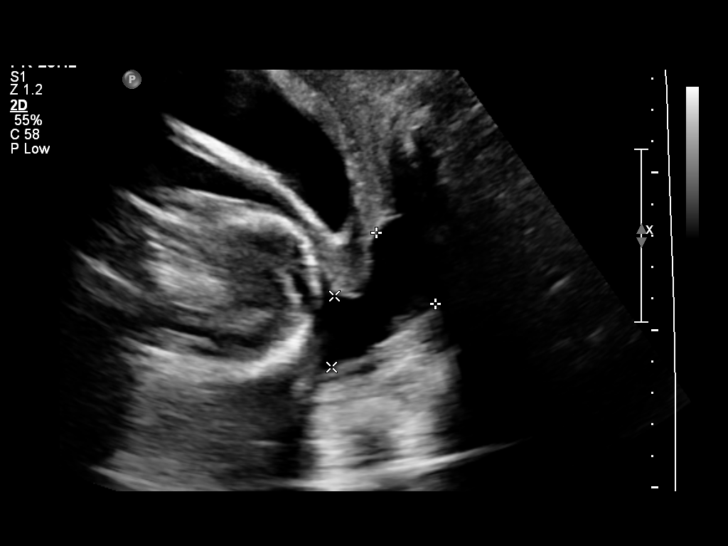
[im 15/18]
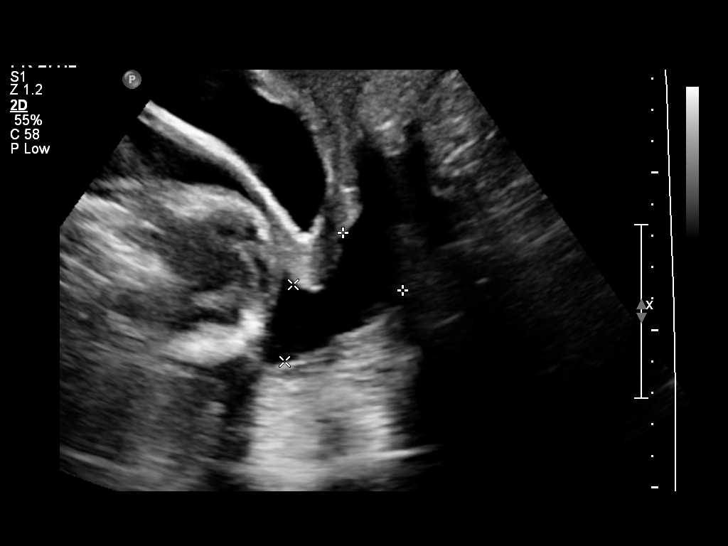
[im 17/18]
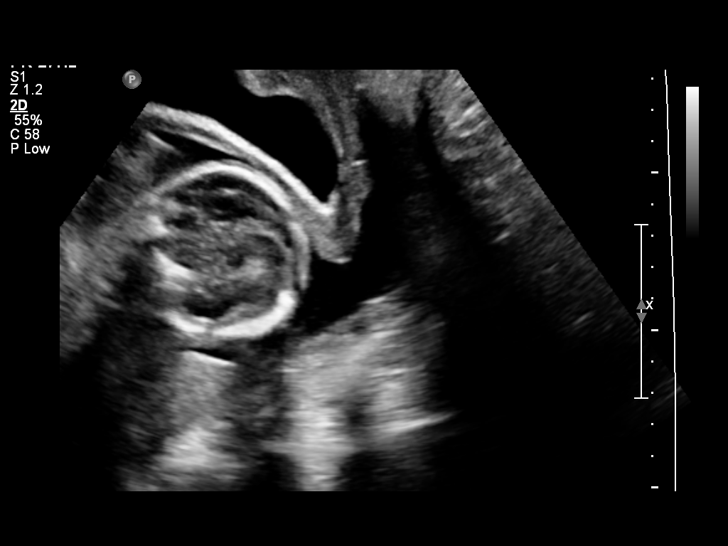
[im 18/18]
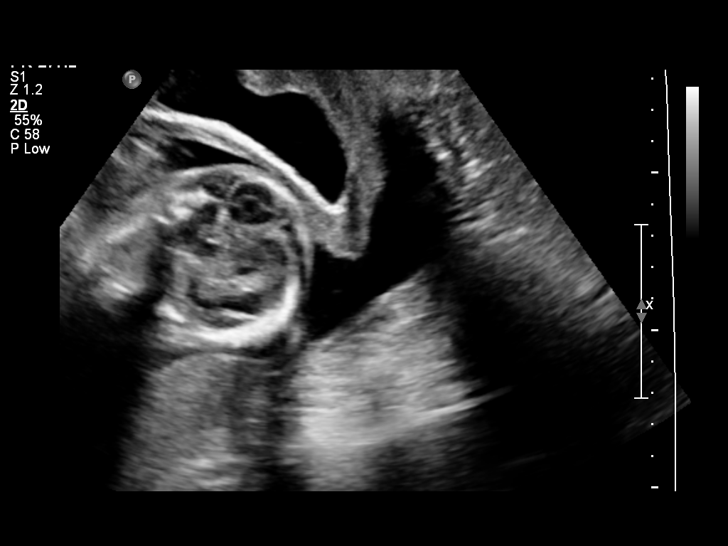

[14 of 18 positions shown; findings below may reference images not displayed]

OBSTETRICS REPORT
                      (Signed Final 11/04/2010 [DATE])

 Order#:         [id number]
Procedures

 [HOSPITAL]                                         [address]15.0
Indications

 Cervical insufficiency
 Assess cervical length
Fetal Evaluation

 Fetal Heart Rate:  152                          bpm
 Cardiac Activity:  Observed
 Presentation:      Cephalic
 Placenta:          Anterior, above cervical os

 Amniotic Fluid
 AFI FV:      Subjectively within normal limits
                                             Larg Pckt:     7.5  cm
Gestational Age

 Clinical EDD:  20w 3d                                        EDD:   03/21/11
 Best:          20w 3d     Det. By:  Clinical EDD             EDD:   03/21/11
Cervix Uterus Adnexa

 Cervical Length:    0        cm       Funnel Width:   2.9       cm
 Funnel Length:      4.5      cm

 Cervix:       Measured transvaginally.
Impression

 Single live IUP in cephalic presentation.  Cervix open by CHOUDHARY
 technique with no measurable residual length, unchanged.

## 2010-11-06 ENCOUNTER — Inpatient Hospital Stay (HOSPITAL_COMMUNITY): Payer: BC Managed Care – PPO

## 2010-11-06 LAB — CBC
Hemoglobin: 11.9 g/dL — ABNORMAL LOW (ref 12.0–15.0)
MCHC: 34.7 g/dL (ref 30.0–36.0)
RBC: 4.26 MIL/uL (ref 3.87–5.11)

## 2010-11-06 IMAGING — US US OB LIMITED
1 series · 14 of 16 positions shown · non-contrast
Comparison: none

[Series 1: us ob limited · 0.19mm/px · 14 of 16 slices shown]
[im 1/16]
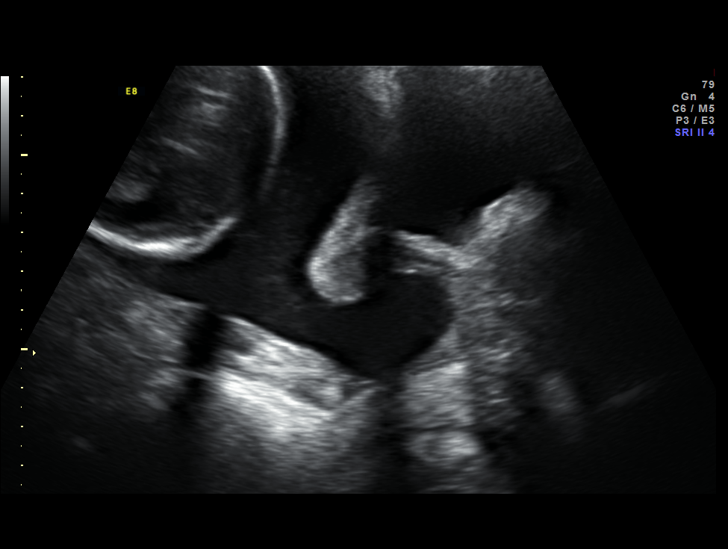
[im 2/16]
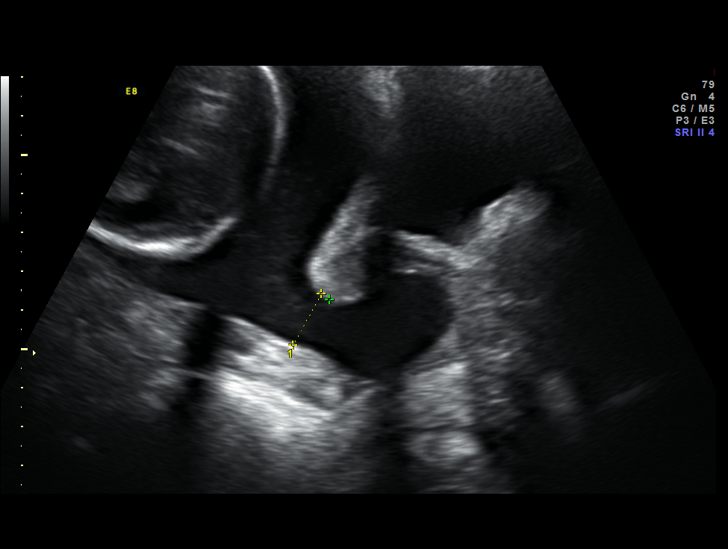
[im 3/16]
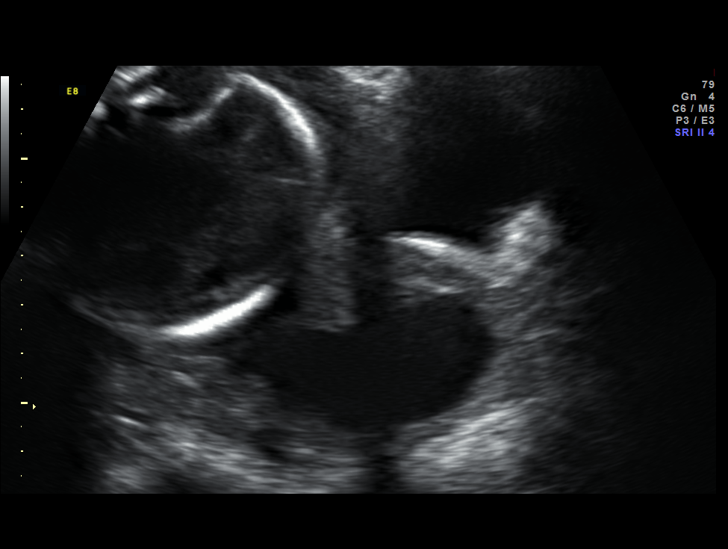
[im 5/16]
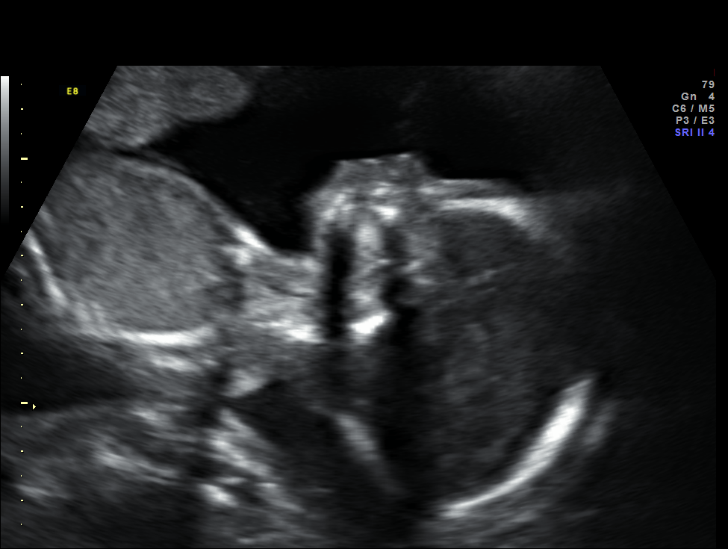
[im 6/16]
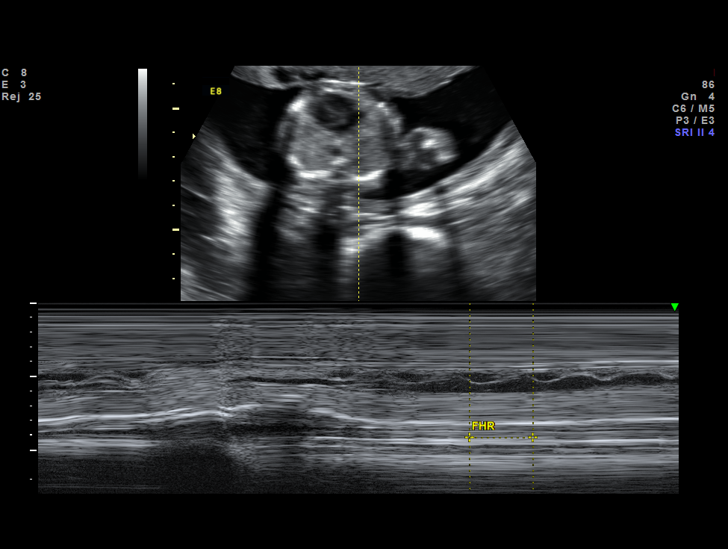
[im 7/16]
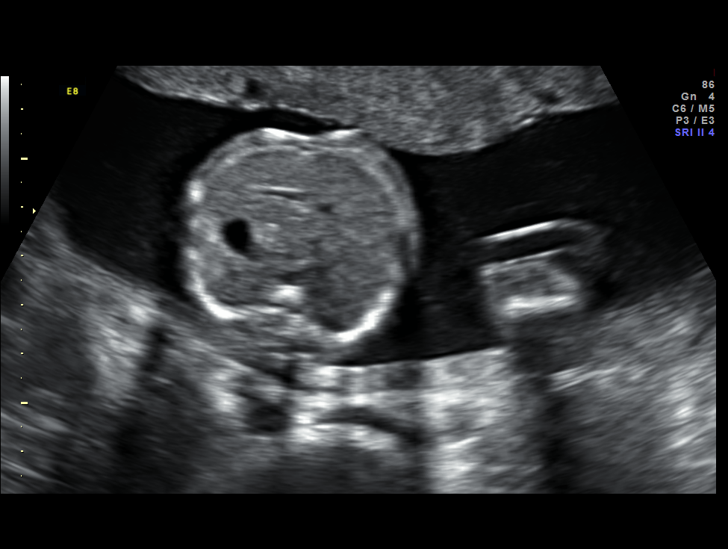
[im 8/16]
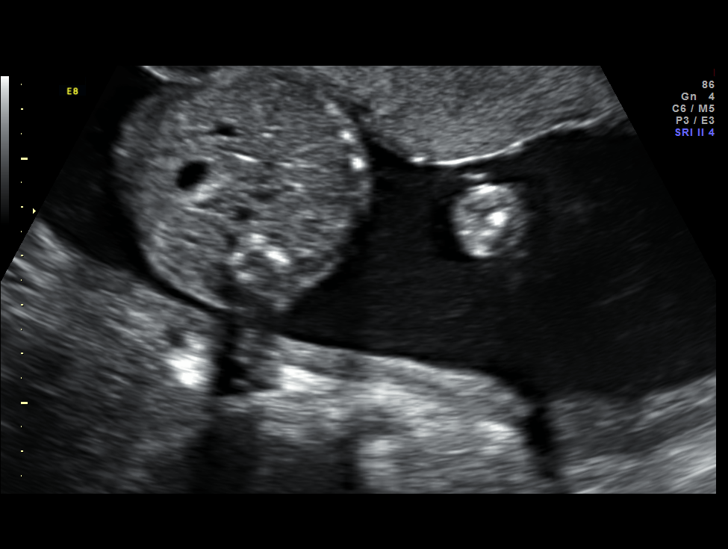
[im 9/16]
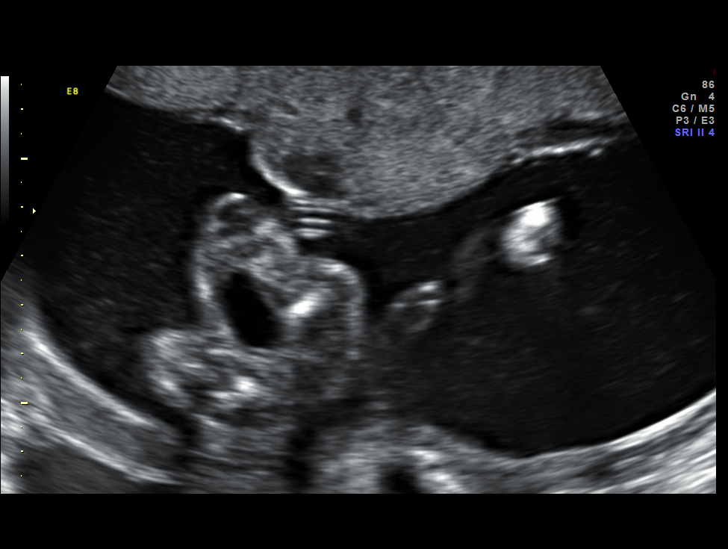
[im 10/16]
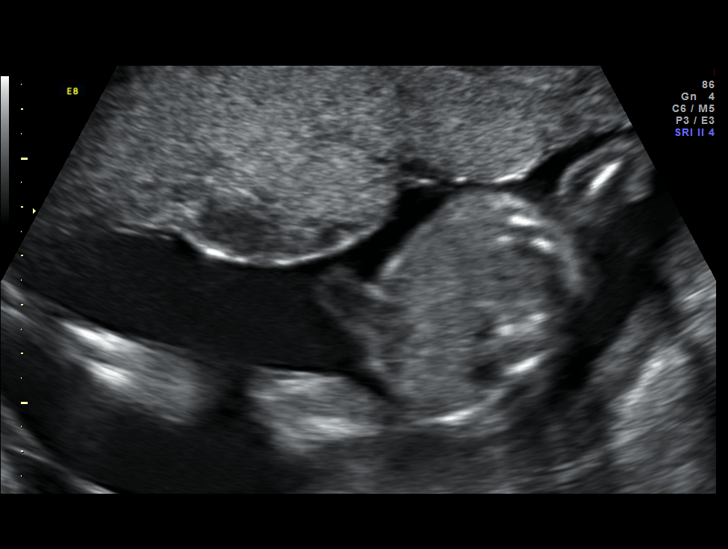
[im 11/16]
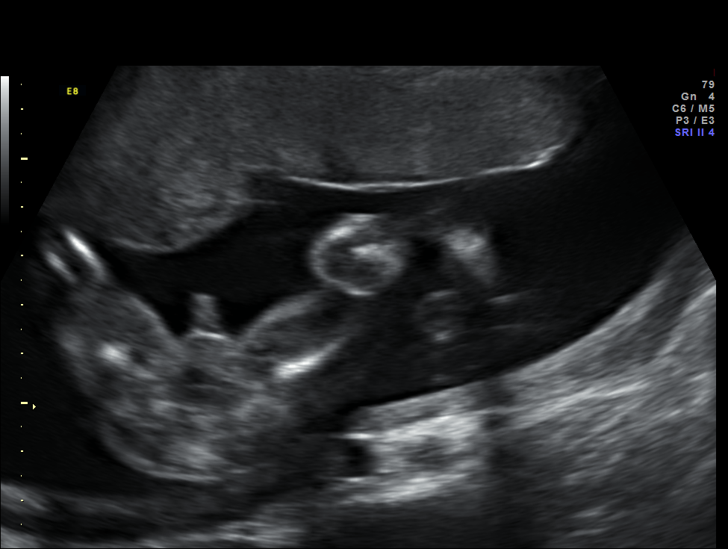
[im 13/16]
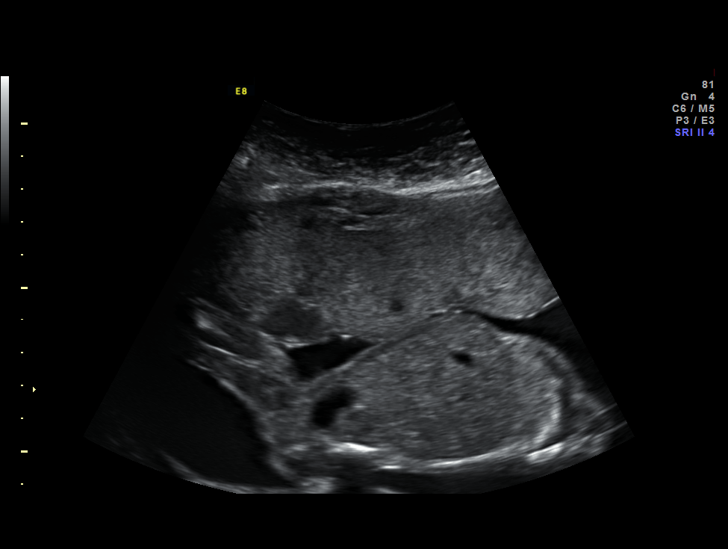
[im 14/16]
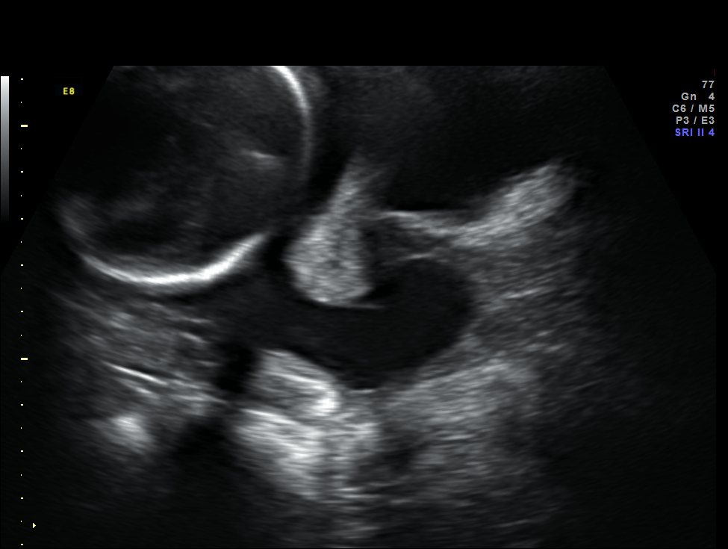
[im 15/16]
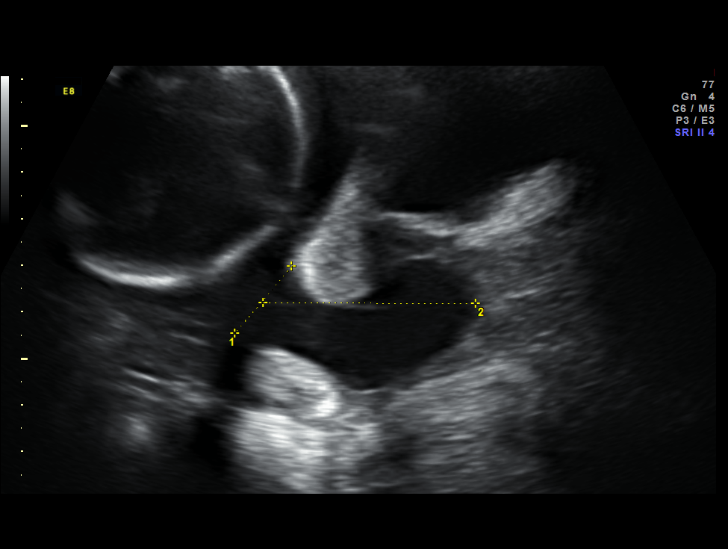
[im 16/16]
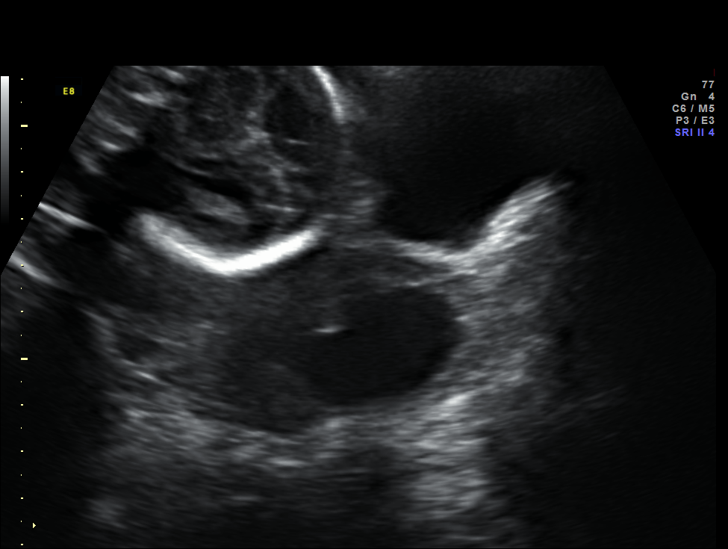

[14 of 16 positions shown; findings below may reference images not displayed]

Canned report from images found in remote index.

Refer to host system for actual result text.

## 2010-11-11 NOTE — Op Note (Signed)
  NAMELAVERTA, Victoria [name]             ACCOUNT NO.:  [id number]  MEDICAL RECORD NO.:  [id number]           PATIENT TYPE:  I  LOCATION:  9153                          FACILITY:  [facility]  PHYSICIAN:  Zelphia Cairo, MD    DATE OF BIRTH:  [birth year]  DATE OF PROCEDURE:  11/07/2010 DATE OF DISCHARGE:                              OPERATIVE REPORT   PREOPERATIVE DIAGNOSES: 1. Advanced cervical effacement. 2. Advanced cervical dilation.  POSTOPERATIVE DIAGNOSES: 1. Advanced cervical effacement. 2. Advanced cervical dilation.  PROCEDURE:  Rescue single McDonald cervical cerclage.  SURGEON:  Zelphia Cairo, MD.  ASSISTANT:  Juluis Mire, MD.  ANESTHESIA:  Spinal.  COMPLICATIONS:  None.  CONDITION:  Stable to recovery room.  DESCRIPTION OF PROCEDURE:  [name] was taken to the operating room after informed consent was obtained.  She was given spinal anesthesia and placed in Trendelenburg position.  External perineum was prepped with Betadine and the vagina was flushed with sterile saline.  On exam, she was noted to be 3 cm with membranes prolapsing through the vagina.  No fetal parts were noted in these membranes.  The anterior and posterior lip of the cervix were grasped with ring forceps and Ethibond pursestring suture was placed around the cervix.  Membranes were reduced with one finger as the suture was cinched down at 12 o'clock.  Suture was trimmed.  The cervix was soft and closed at the end of the case. The patient was taken to the recovery room in stable condition.     Zelphia Cairo, MD     GA/MEDQ  D:  11/07/2010  T:  11/07/2010  Job:  [id number]  Electronically Signed by Zelphia Cairo MD on 11/11/2010 01:40:36 PM

## 2010-11-13 ENCOUNTER — Encounter (HOSPITAL_COMMUNITY): Payer: Self-pay | Admitting: Radiology

## 2010-11-13 ENCOUNTER — Inpatient Hospital Stay (HOSPITAL_COMMUNITY): Payer: BC Managed Care – PPO

## 2010-11-13 IMAGING — US US OB LIMITED
1 series · 13 of 28 positions shown · non-contrast
Comparison: none

[Series 1: us ob follow up · 13 of 34 slices shown]
[im 2/34]
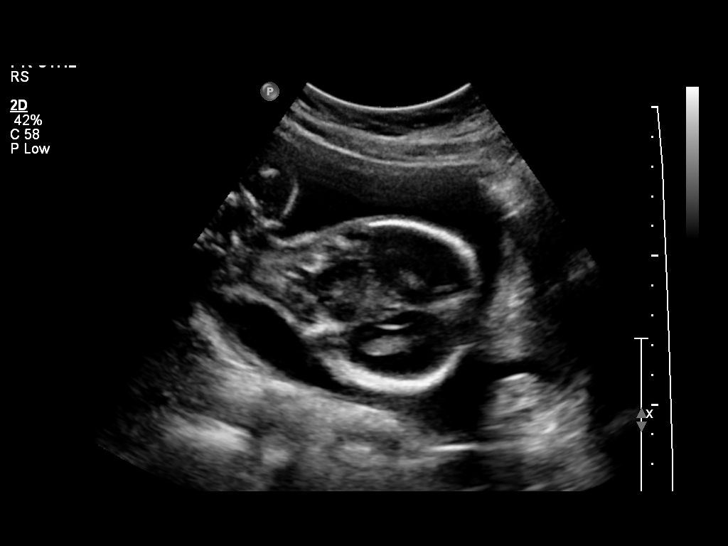
[im 4/34]
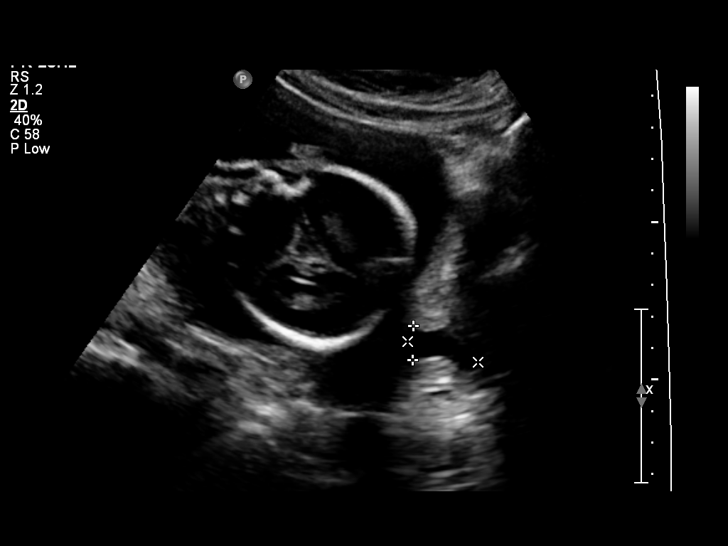
[im 7/34]
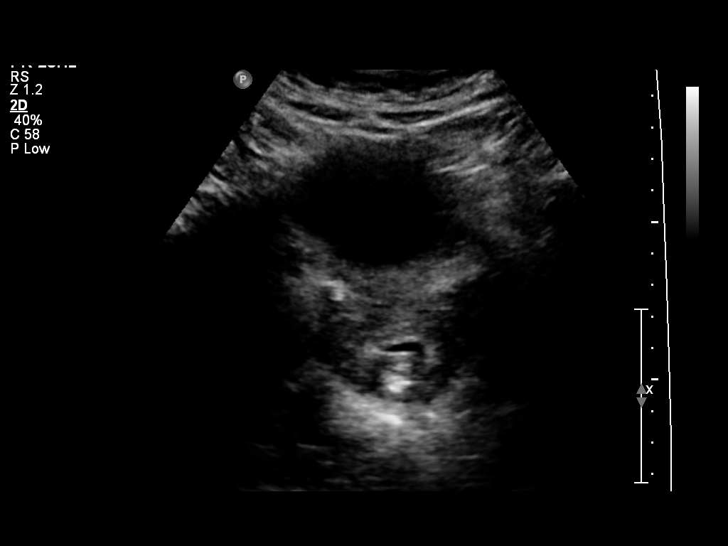
[im 9/34]
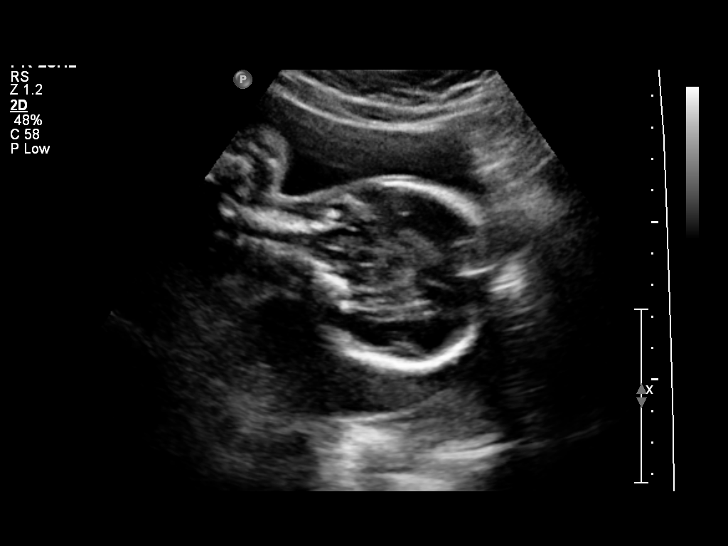
[im 12/34]
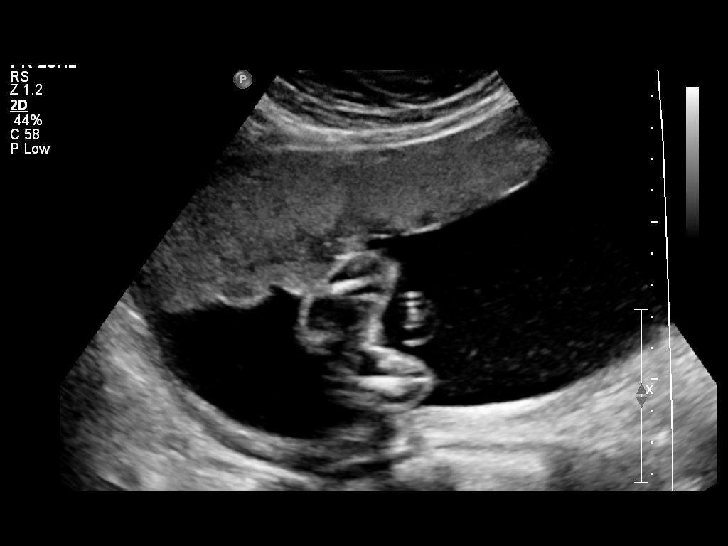
[im 14/34]
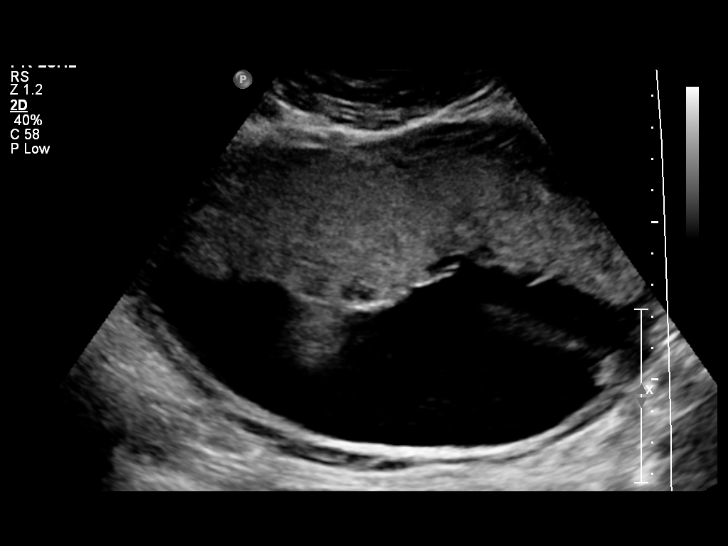
[im 18/34]
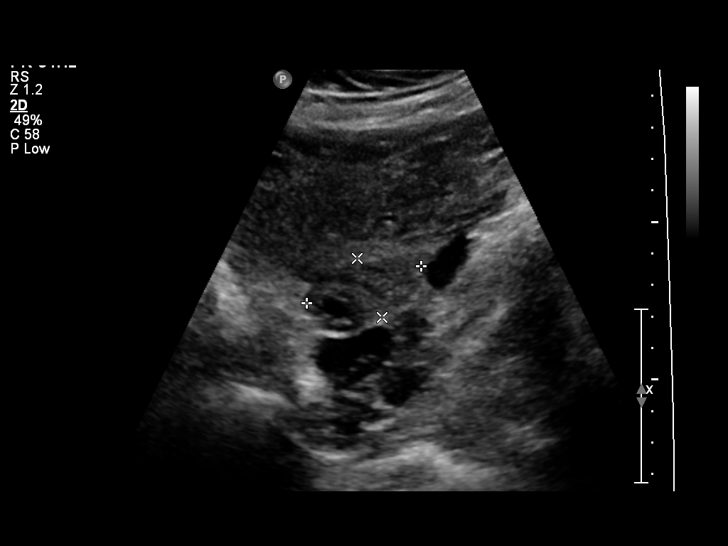
[im 20/34]
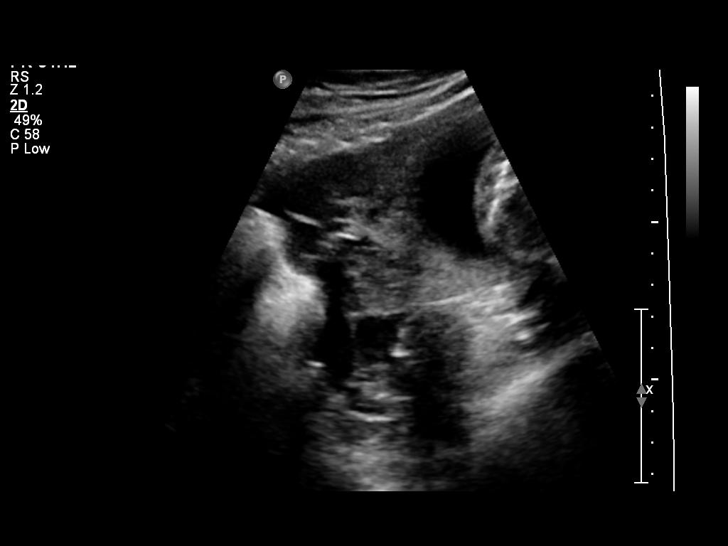
[im 23/34]
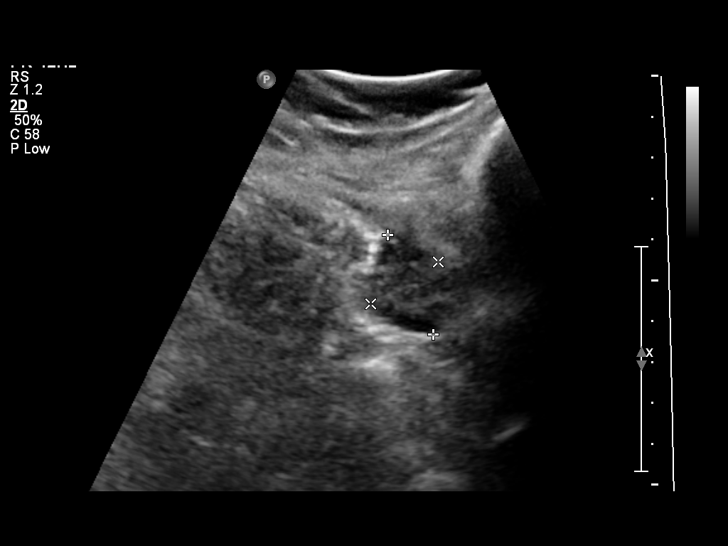
[im 25/34]
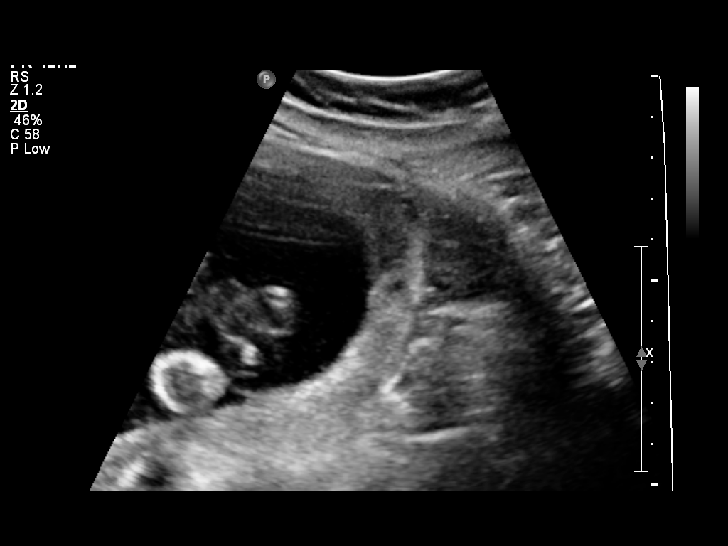
[im 27/34]
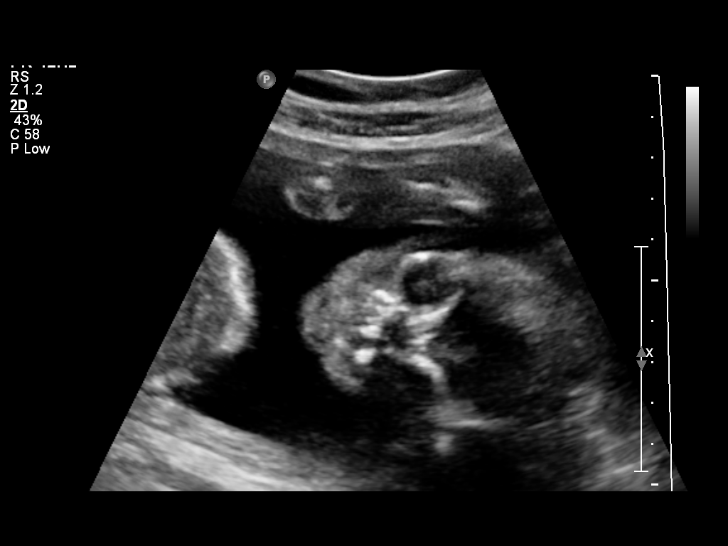
[im 30/34]
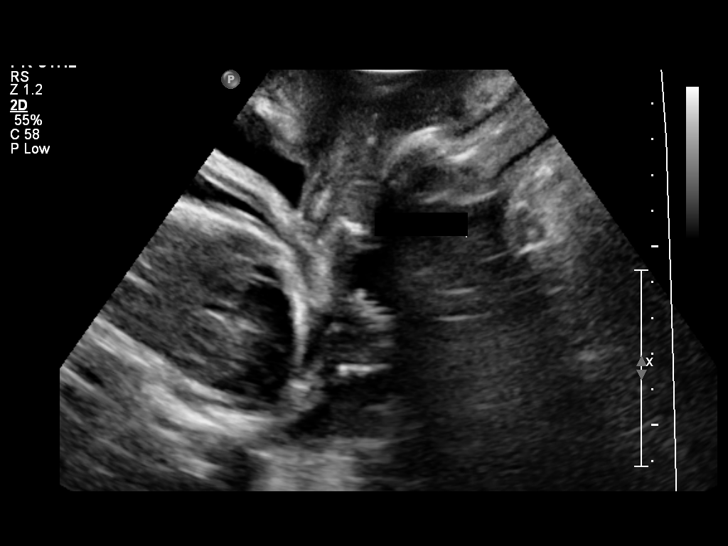
[im 32/34]
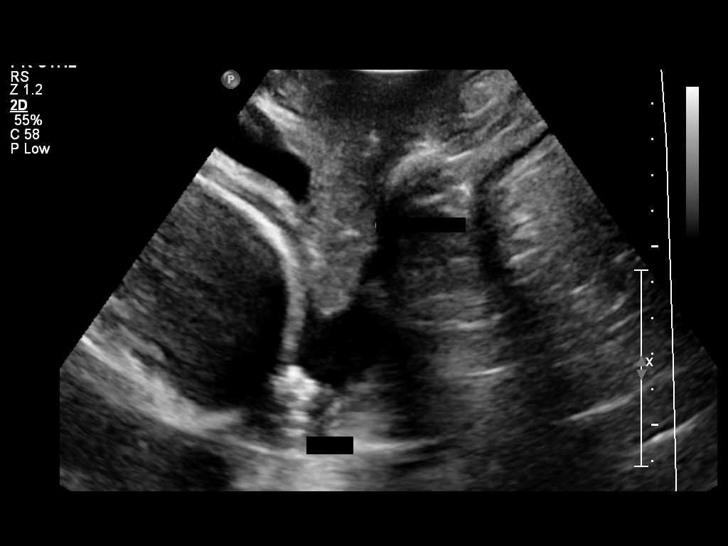

[13 of 28 positions shown; findings below may reference images not displayed]

OBSTETRICS REPORT
                      (Signed Final 11/13/2010 [DATE])

 Order#:         [id number]
Procedures

 [HOSPITAL]                                         76815.0
Indications

 Cervical insufficiency
 Assess cervical length (S/P cerclage 11/07/10)
 Poor obstetric history-Recurrent (habitual) abortion
 (3 consecutive ab's)
 Poor obstetric history: Previous midtrimester loss
 (20 wks)
 Diverticulitis
Fetal Evaluation

 Fetal Heart Rate:  146                          bpm
 Cardiac Activity:  Observed
 Presentation:      Cephalic
 Placenta:          Anterior, above cervical os

 Amniotic Fluid
 AFI FV:      Subjectively within normal limits
                                             Larg Pckt:     7.5  cm
Gestational Age

 Clinical EDD:  21w 5d                                        EDD:   03/21/11
 Best:          21w 5d     Det. By:  Clinical EDD             EDD:   03/21/11
Cervix Uterus Adnexa

 Cervical Length:    0        cm       Funnel Width:   2.3       cm
 Funnel Length:      3.4      cm

 Cervix:       Cerclage visualized.  Measured translabially.
Impression

 Translabial views of the cervix show no residual length with
 funneling to the external os.

 Subjectively normal amniotic fluid volume.

## 2010-11-21 ENCOUNTER — Inpatient Hospital Stay (HOSPITAL_COMMUNITY): Payer: BC Managed Care – PPO

## 2010-11-21 ENCOUNTER — Other Ambulatory Visit: Payer: Self-pay | Admitting: Obstetrics and Gynecology

## 2010-11-21 IMAGING — US US OB LIMITED
1 series · 13 of 20 positions shown · non-contrast
Comparison: none

[Series 1: us ob limited · 20 acquisitions, 13 frames shown]
[im 1/20]
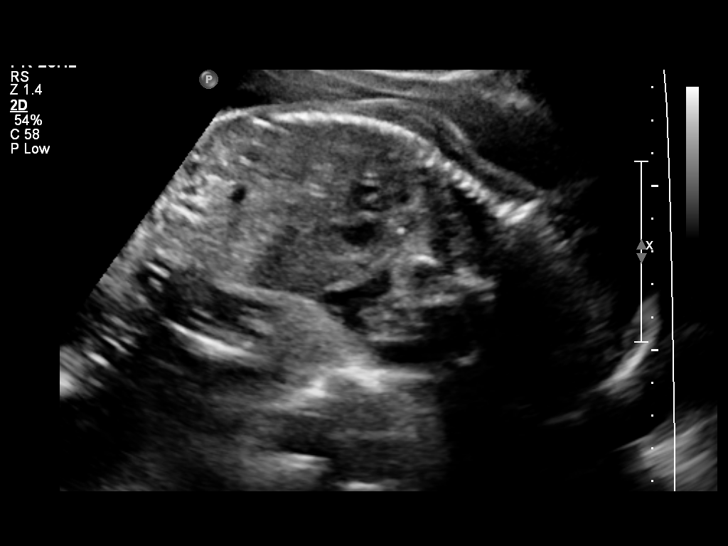
[im 3/20]
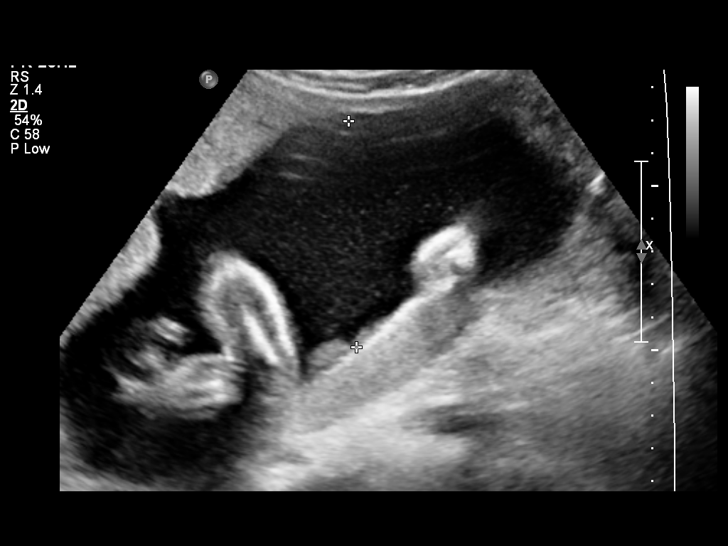
[im 4/20]
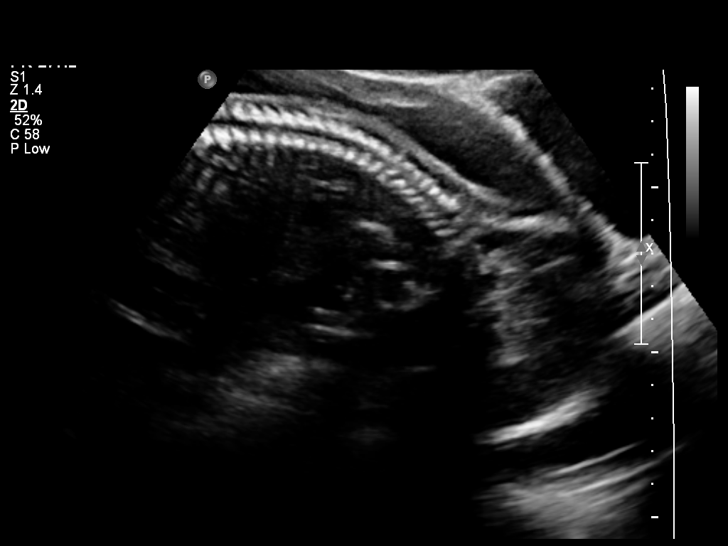
[im 6/20]
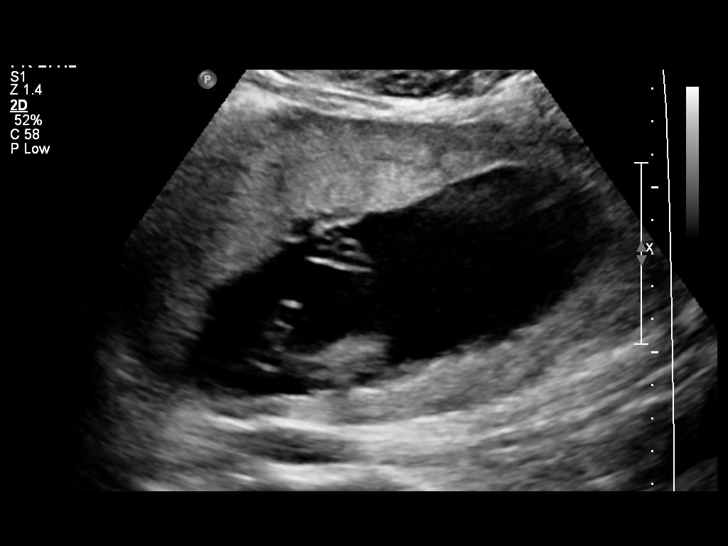
[im 7/20]
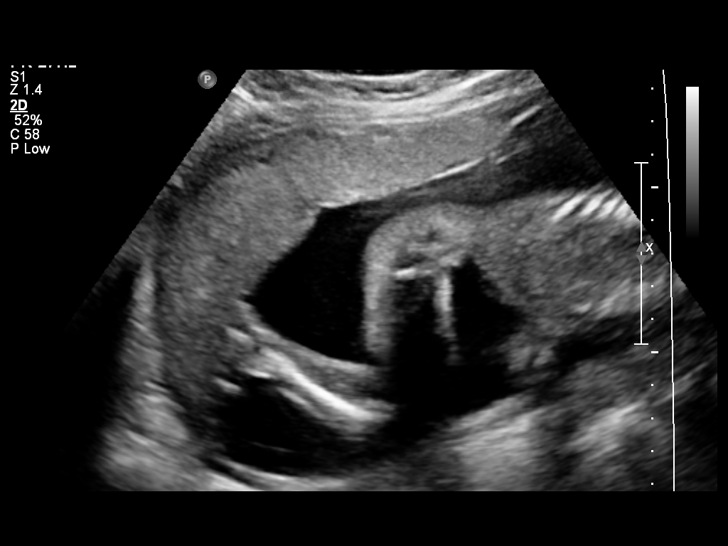
[im 9/20]
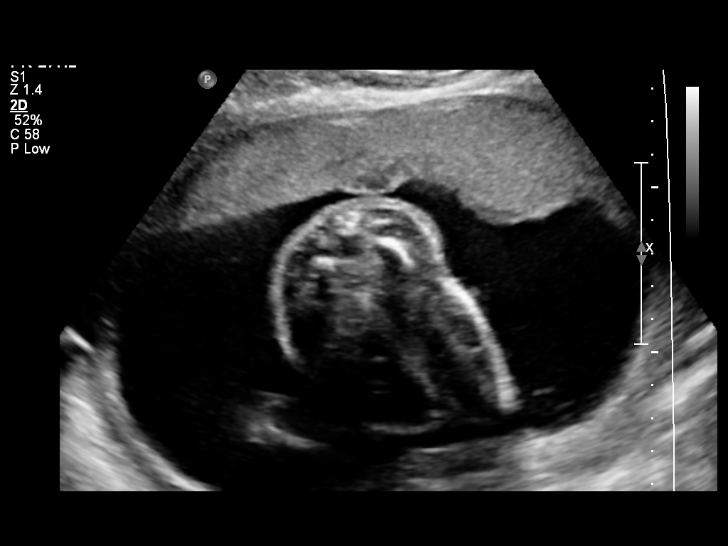
[im 11/20]
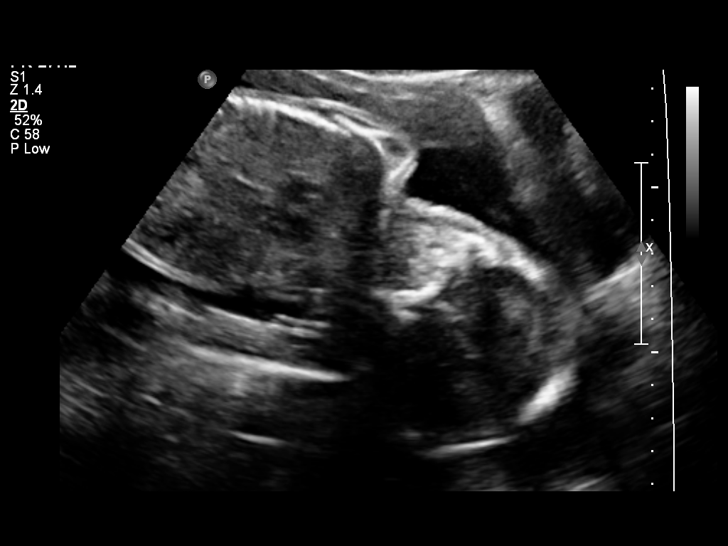
[im 12/20]
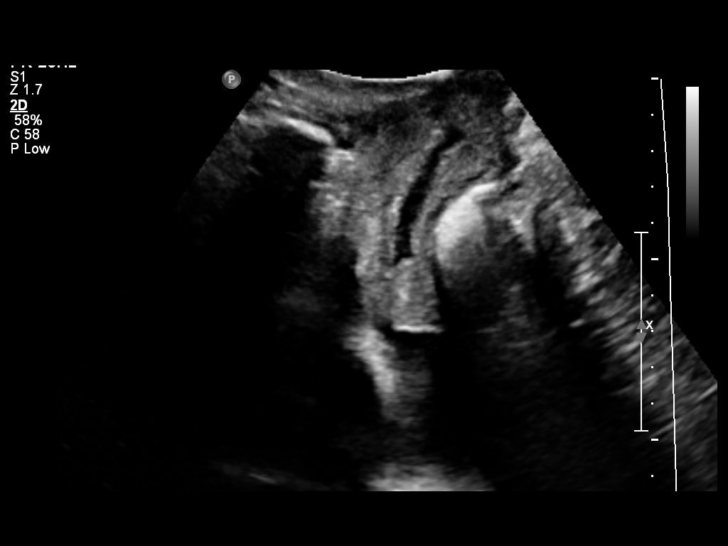
[im 14/20]
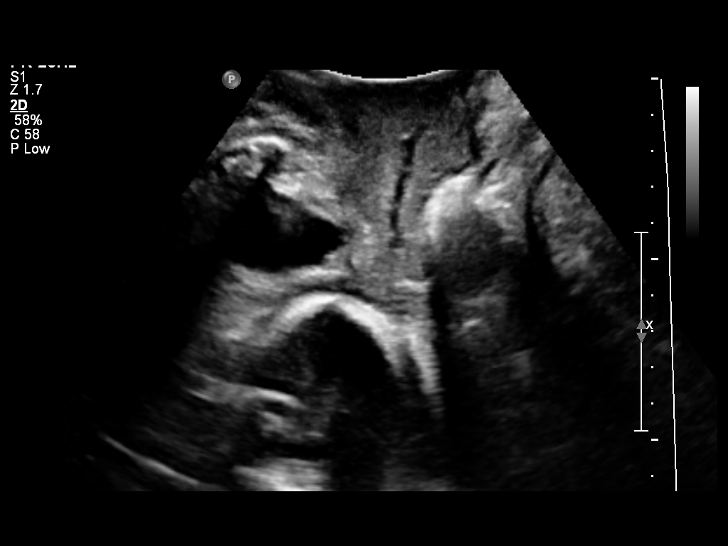
[im 15/20]
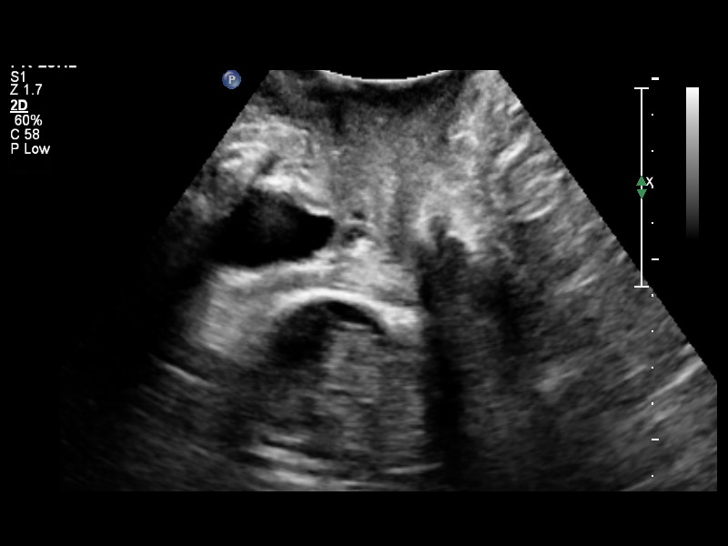
[im 17/20]
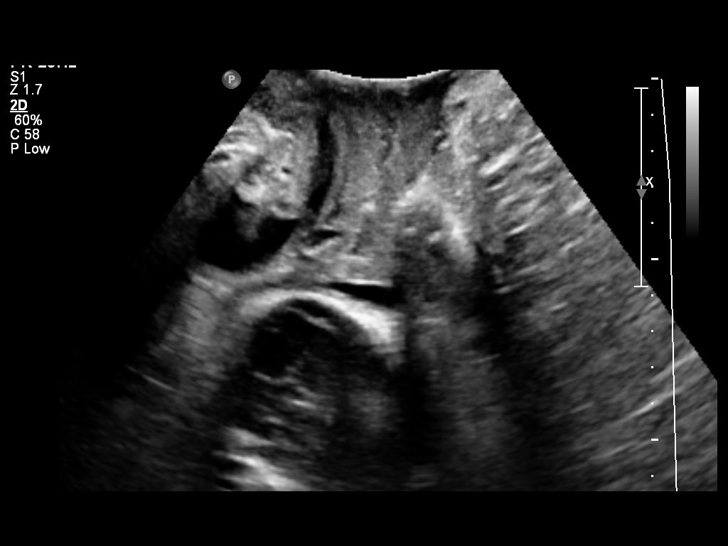
[im 18/20]
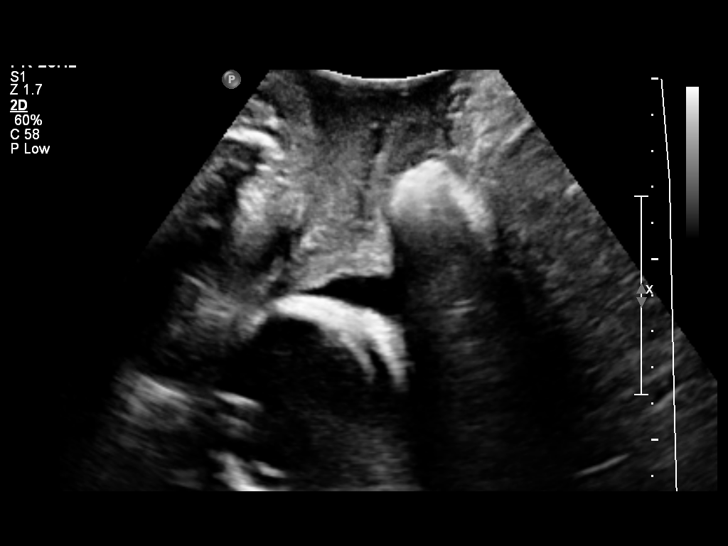
[im 20/20]
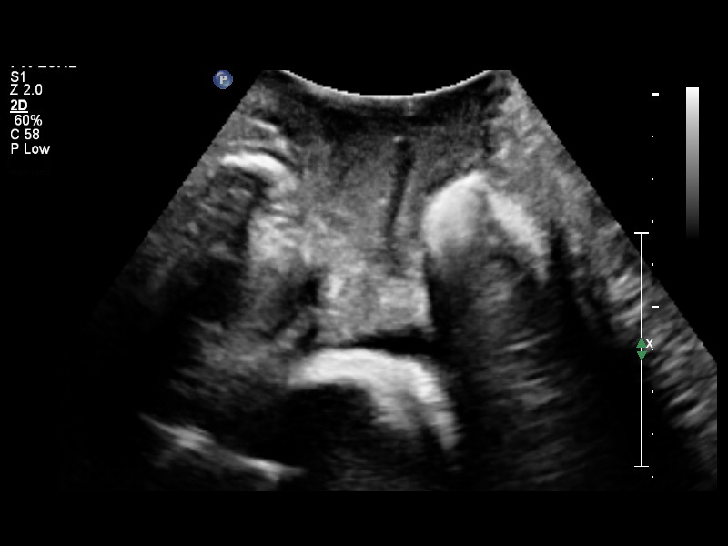

[13 of 20 positions shown; findings below may reference images not displayed]

OBSTETRICS REPORT
                      (Signed Final 11/21/2010 [DATE])

 Order#:         [id number]
Procedures

 [HOSPITAL]                                         76815.0
Indications

 Cervical insufficiency
 Assess cervical length (S/P cerclage 11/07/10)
 Poor obstetric history-Recurrent (habitual) abortion
 (3 consecutive ab's)
 Poor obstetric history: Previous midtrimester loss
 (20 wks)
 Uncertain fetal presentation
 Preterm labor (> 22 weeks)
Fetal Evaluation

 Fetal Heart Rate:  169                          bpm
 Cardiac Activity:  Observed
 Presentation:      Cephalic
 Placenta:          Anterior, above cervical os

 Amniotic Fluid
 AFI FV:      Subjectively within normal limits
                                             Larg Pckt:    6.87  cm
Gestational Age

 Clinical EDD:  22w 6d                                        EDD:   03/21/11
 Best:          22w 6d     Det. By:  Clinical EDD             EDD:   03/21/11
Cervix Uterus Adnexa

 Cervical Length:    0        cm

 Cervix:       Imaged translabially.
Impression

 Cervix not visible transabdominally. No definite measurable
 cervix with translabial exam, but assessment is difficult. Fluid
 noted in the vaginal vault.

 Subjectively normal amniotic fluid volume.
 Cephalic presentation.

## 2010-11-22 LAB — CBC
HCT: 28.9 % — ABNORMAL LOW (ref 36.0–46.0)
Hemoglobin: 9.7 g/dL — ABNORMAL LOW (ref 12.0–15.0)
WBC: 19.2 10*3/uL — ABNORMAL HIGH (ref 4.0–10.5)

## 2010-11-26 NOTE — H&P (Signed)
  NAMEAVANTI, [name]             ACCOUNT NO.:  [id number]  MEDICAL RECORD NO.:  [id number]          PATIENT TYPE:  LOCATION:                                 FACILITY:  PHYSICIAN:  Duke Salvia. Marcelle Overlie, M.D.    DATE OF BIRTH:  DATE OF ADMISSION: DATE OF DISCHARGE:                             HISTORY & PHYSICAL   CHIEF COMPLAINT:  Short cervix at 20 weeks' gestation.  HISTORY OF PRESENT ILLNESS:  A 30 year old G6, P 1-0-4-1 at 19-6/7 weeks.  She has been on weekly progesterone for a prior history of PPROM in 2003.  She did have a full term vaginal delivery in September 2010, was seen on April 5 with a cervix that was long and closed, her routine ultrasound today showed a cervical length of 1.1 with funneling.  On speculum exam, I could see membranes just at the os of the cervix that was 1 cm dilated.  She is not feeling any unusual contractions, has not had any leaking fluid, blood type is B+.  PAST MEDICAL HISTORY:  Please see her [employer] form for details.  PHYSICAL EXAMINATION:  VITAL SIGNS:  Temperature 98.2, blood pressure 120/78. HEENT:  Unremarkable. NECK:  Supple without masses. LUNGS:  Clear. CARDIOVASCULAR:  Regular rate and rhythm without murmurs, rubs or gallops. BREASTS:  Not examined. ABDOMEN:  Soft, flat and nontender.  Fundal height was at the umbilicus. Fetal heart rate 140.  Cervix on ultrasound 1.1 cm with funneling centimeter dilated.  On speculum exam, membranes intact.  IMPRESSION: 1. 19-6/[redacted] weeks gestation. 2. Incompetent cervix. 3. History of preterm premature rupture of membranes at 20 weeks in     2003.  PLAN:  We will admit for head down positioning and bedrest.  We will do vaginal swab for group B strep and start antibiotics until culture is available.  We will consider followup ultrasound and possible attempted cervical cerclage if there is some cervical thickening noted.     Leslie Langille M. Marcelle Overlie, M.D.     RMH/MEDQ  D:  10/31/2010   T:  10/31/2010  Job:  [id number]  Electronically Signed by Richarda Overlie M.D. on 11/26/2010 09:26:12 AM

## 2010-12-05 NOTE — Discharge Summary (Signed)
Victoria [name], [name]                            ACCOUNT NO.:  [id number]   MEDICAL RECORD NO.:  [id number]                   PATIENT TYPE:  INP   LOCATION:  9319                                 [facility]ACILITY:  [facility]   PHYSICIAN:  Conni Elliot, M.D.             DATE OF BIRTH:  April 29, 1981   DATE OF ADMISSION:  04/18/2002  DATE OF DISCHARGE:  04/22/2002                                 DISCHARGE SUMMARY   ADMITTING DIAGNOSES:  1. Intrauterine pregnancy at 21 weeks and 6 days.  2. Premature rupture of membranes status post amniocentesis five days prior.  3. Marginal placenta separation.  4. Oligohydramnios.   HISTORY:  The patient is a 30 year old gravida 3, para 0-0-2-0 with last  menstrual period of November 14, 2001, giving her an estimated date of delivery  of August 22, 2002.  The patient presents to maternity admissions with  complaint of leaking fluid x24 hours and onset of bright red vaginal  bleeding.  Vital signs are stable on examination with fetal heart tones at  158.  The patient received [facility] care at [facility] since approximately [redacted] weeks gestation.  The patient was in  [place] visiting relatives.   OB PANEL:  The patient is B positive.  Antibody screen negative.  Rubella is  immune.  Hepatitis B surface antigen is negative.  Syphilis screen was  negative.  HIV screen was nonreactive.  Gonorrhea and Chlamydia cultures  were negative.  Her alpha fetoprotein test was within normal limits.  Anatomy ultrasound at 18-20 weeks revealed mild right fetal pyelectasis  which physician felt needed further evaluation.  The patient was counseled  and received an amniocentesis five days prior to admission.   ALLERGIES:  The patient has no known drug allergies.   CURRENT MEDICATIONS:  Senna-Gen and prenatal vitamins one tablet daily.   PAST MEDICAL HISTORY:  The patient has no significant medical history or  surgical history.  The patient had two  termination ABs within first  trimester.  No other sexually transmitted disease history.   SOCIAL HISTORY:  The patient is a single African-American female.  Father of  the baby is supportive and with patient.  The patient has no substance abuse  history.   HOSPITAL COURSE:  The patient was admitted on September 30 under expectant  management.  The patient was afebrile.  Baseline CBC revealed white blood  cell count of 8.4, hemoglobin of 9.8, hematocrit of 28.2, and a platelet  count of 217,000.  The patient was started on IV antibiotics Unasyn 3 g IV  q.6h. and continuous toco to monitor contraction activity.  The patient  underwent an OB ultrasound which revealed a vertex presentation, cervical  length of 2.6 cm, no amniotic fluid measurable on ultrasound, fundal  placenta with a right lateral placental separation noted on ultrasound.  Day  two of hospitalization patient had mild elevation  in temperature to 100, no  other symptoms.  The patient continues with pink to brown discharge.  Fetal  heart tones are stable.  The patient continued on expectant management.  On  day three of hospitalization patient's white count is beginning to elevate.  Vital signs still remained stable.  No elevation in temperature greater than  100 other than the single one on day two.  White blood cell count today is  14.5.  Hemoglobin is 9.7, hematocrit 27.5, platelet count of 212,000.  A DIC  panel is within normal limits for pregnancy.  The patient counseled related  to risks of infection and prolonged rupture of membranes at early gestation.  The patient given the option of continued expectant management following any  risk factors for infection or ongoing symptoms of infection versus an  induction of labor.  After consideration with family and father of the baby,  patient elected for induction of labor.  The patient received Cytotec 200 mg  q.4h. for induction of labor.  On October 3 at 4:15 patient  delivered a  female with Apgar scores of 2 at one minute, 1 at 5 minutes, 1 at 10  minutes.  Infant expired at 0535.  Weight at birth was 15.5 ounces.  The  patient had a retained placenta.  After several hours of Cytotec and  attempts at manual extraction, patient consented for OR dilatation and  curettage for retained placenta.  Dr. Shawnie Pons was physician in OR.  The  patient transferred to PACU in stable condition.  Continued to have routine  postpartum care.  On October 4 postpartum day/postoperative day number one  status post a nonviable delivery at 21 weeks and retained placenta with D&C,  patient is tolerating diet well, voiding without difficulties.  No  complaints of pain.  The patient decided to autopsy fetus and then  cremation.  Funeral home arrangements are being handled through the  hospital.  The patient will follow up with a postpartum examination in four  weeks with Dr. Gavin Potters here at [facility].  The patient is afebrile.  Vital signs are stable, 98.5, pulse 86, respirations 20, blood pressure  110/57.  She is having a scant amount of __________ .  Abdomen is soft,  nontender, nondistended.  Family supports staying with patient.  The patient  is discharged home today in stable condition.  The patient is given the  number for grief support and the chaplain number to follow up with the  hospital as needed.  She is discharged home in stable condition.   DISCHARGE MEDICATIONS:  1. Motrin 600 mg p.o. q.6-8h. as needed for pain or cramps.  2. The patient to start Ortho Evra birth control patch two weeks on a second     Sunday.  Prescription given to patient and instructions reviewed.   DIET:  Regular.   ACTIVITY:  Rest at home x2 weeks and then resume normal activity.  To resume  sexual intercourse in four to six weeks after all bleeding has resolved and  contraceptive method is in place.  FOLLOW UP:  The patient to follow up with Dr. Gavin Potters on October 28 for a   routine postpartum examination.  To call with any elevation in temperature  greater than 100.5, heavy bleeding, or abdominal pain in the interim between  now and her appointment with Dr. Gavin Potters.     Marlinda Mike, C.N.M.  Conni Elliot, M.D.    TB/MEDQ  D:  05/30/2002  T:  05/30/2002  Job:  [id number]

## 2010-12-05 NOTE — Op Note (Signed)
NAMEALONDRIA, MOUSSEAU                            ACCOUNT NO.:  [id number]   MEDICAL RECORD NO.:  [id number]                   PATIENT TYPE:  INP   LOCATION:  9180                                 [facility]ACILITY:  [facility]   PHYSICIAN:  Tanya S. Shawnie Pons, M.D.                DATE OF BIRTH:  09/06/1980   DATE OF PROCEDURE:  04/21/2002  DATE OF DISCHARGE:                                 OPERATIVE REPORT   PREOPERATIVE. DIAGNOSES:  Retained placenta.   POSTOPERATIVE DIAGNOSES:  Retained placenta.   PROCEDURE:  Dilatation and curettage.   SURGEON:  Shelbie Proctor. Shawnie Pons, M.D.   ANESTHESIA:  MAC with Dr. Harvest Forest.   FINDINGS:  Multiple fragments of retained placenta.   ESTIMATED BLOOD LOSS:  50-100 cc.   SPECIMENS:  Placenta to pathology.   REASON FOR PROCEDURE:  Briefly, patient is a 30 year old gravida 3, para 0-1-  2-0 who is postpartum day 0 from a vaginal delivery of a 22 week fetus after  previable premature preterm rupture of membranes.  The patient had received  several bags of IV Pitocin as well as rectal Cytotec without subsequent  delivery.  Attempts to remove the placenta manually was unsuccessful.  For  this reason patient was counseled regarding the risks and benefits of this  procedure and taken to the OR to have it removed.  All consents were signed  prior to proceeding.   PROCEDURE:  The patient was taken to the OR where she was placed in the  dorsal lithotomy and Allen stirrups.  MAC anesthesia was administered.  She  was prepped with some Betadine around the perineum.  Catheter was used to  drain approximately 200 cc of clear yellow urine.  The placenta was  partially out of the vagina and the rest was inside the cervix.  Attempt to  remove it in block was unsuccessful and a large curette was then passed in  and multiple chunks of placenta were then removed from the uterine cavity.  This was continued until no more chunks of placenta were found and there was  a gritty surface in all  four quadrants of the uterus.  All instruments were  then removed and IV Pitocin was hung for this patient.  Bimanual examination  was done.  It confirmed a firm uterus and minimal amount of bleeding.  The  uterus did stay contracted.  The patient was taken out of dorsal lithotomy  into recovery room in stable condition.  At the end of procedure all  instrument, needle, and counts were correct x2.                                               Shelbie Proctor. Shawnie Pons, M.D.    TSP/MEDQ  D:  04/21/2002  T:  04/21/2002  Job:  [id number]

## 2010-12-19 DEATH — deceased

## 2010-12-24 NOTE — Discharge Summary (Signed)
Victoria [name], Victoria [name]             ACCOUNT NO.:  [id number]  MEDICAL RECORD NO.:  [id number]  LOCATION:  9309                          [facility]ACILITY:  [facility]  PHYSICIAN:  Zelphia Cairo, MD    DATE OF BIRTH:  1981/01/17  DATE OF ADMISSION:  10/31/2010 DATE OF DISCHARGE:  11/24/2010                              DISCHARGE SUMMARY   ADMITTING DIAGNOSES: 1. Intrauterine pregnancy at 19-6/7th weeks estimated gestational age. 2. Incompetent cervix. 3. History of preterm labor with preterm premature rupture of     membranes at 20 weeks.  DISCHARGE DIAGNOSIS:  Status post spontaneous vaginal delivery of a nonviable female infant.  PROCEDURE:  November 07, 2010, McDonald cervical cerclage and Nov 21, 2010 spontaneous vaginal delivery.  REASON FOR ADMISSION:  Please see dictated H and P.  HOSPITAL COURSE:  The patient is a 30 year old gravida 6, para 1-0-4-1 who was admitted to [facility] at 19-6/7th weeksestimated gestational age for cervical shortening and cervical dilatation.  The patient's pregnancy had been complicated by history of preterm premature rupture of membranes.  The patient had been seen in the office where cervix was noted to be 1.1 cm with funneling and membranes were intact.  The patient was now admitted for Trendelenburg positioning and possible cervical cerclage.  Fetal heart tones were in the 140s which were reactive.  Maternal Fetal Medicine was consulted. Ultrasound was performed which did reveal some cervical length with membranes prolapsing into the vagina.  Discussion was made with the patient regarding the risks of preterm rupture of membranes and delivery of a previable infant.  The patient did understand the risk of a cerclage including chorioamnionitis, preterm rupture of membranes, and preterm delivery.  The patient did decide to undergo cerclage which was performed on November 07, 2010 successfully without complications.  Over the next several days,  the patient did well, minimal spotting, no leaking of fluid, vital signs remained stable, and fetal heart tones were in the 140.  The patient remained on bedrest and progesterone injections and was monitored carefully.  On Nov 21, 2010, the patient did complain of some increasing contractions.  Vital signs were stable. Lungs were clear to auscultation.  Cervix was examined and found to be bag of waters, this was bulging.  Contractions were approximately 2-1/2 minutes apart.  Mag sulfate was started.  Ultrasound was performed which revealed the baby in the vertex presentation with no measurable cervix and fetal parts were now noted to be in the vagina.  NICU was notified. The patient was reexamined and found to be fully dilated with the baby noted to be in the vagina and subsequently baby was delivered which was nonviable female.  Cord was clamped.  Cervical cerclage was removed.  The patient was subsequently transferred to the Mother-Baby Unit where the lochia was decreasing and pain was controlled.  The patient did feel somewhat weak while on her feet and PT was ordered.  Vital signs were stable.  Hemoglobin 9.7.  On postpartum day #2, the patient was able to walk in the hallway with Physical Therapy.  Vital signs were stable.  On the following morning, the patient was continued to work with Physical Therapy regarding ambulation.  Fundus was firm and nontender.  Perineum was intact.  Hemoglobin was 9.7.  Blood type was known to be B+.  DISCHARGE INSTRUCTIONS:  Reviewed and the patient was later discharged home.  CONDITION ON DISCHARGE:  Stable.  DIET:  Regular as tolerated.  ACTIVITY:  Up with assistance with ambulation.  DISCHARGE MEDICATIONS:  Motrin 600 mg every 6 hours and prenatal vitamins 1 p.o. daily.     Julio Sicks, N.P.   ______________________________ Zelphia Cairo, MD    CC/MEDQ  D:  12/22/2010  T:  12/22/2010  Job:  [id number]  Electronically Signed by Julio Sicks N.P. on 12/23/2010 08:20:13 AM Electronically Signed by Zelphia Cairo MD on 12/24/2010 07:40:05 PM

## 2011-09-14 ENCOUNTER — Encounter (HOSPITAL_BASED_OUTPATIENT_CLINIC_OR_DEPARTMENT_OTHER): Payer: Self-pay

## 2011-09-14 ENCOUNTER — Emergency Department (HOSPITAL_BASED_OUTPATIENT_CLINIC_OR_DEPARTMENT_OTHER)
Admission: EM | Admit: 2011-09-14 | Discharge: 2011-09-14 | Disposition: A | Discharge status: Home / Self Care | Payer: Managed Care, Other (non HMO) | Attending: Emergency Medicine | Admitting: Emergency Medicine

## 2011-09-14 DIAGNOSIS — J069 Acute upper respiratory infection, unspecified: Secondary | ICD-10-CM

## 2011-09-14 DIAGNOSIS — R51 Headache: Secondary | ICD-10-CM | POA: Insufficient documentation

## 2011-09-14 HISTORY — DX: Diverticulitis of intestine, part unspecified, without perforation or abscess without bleeding: K57.92

## 2011-09-14 NOTE — Discharge Instructions (Signed)
Antibiotic Nonuse ° Your caregiver felt that the infection or problem was not one that would be helped with an antibiotic. °Infections may be caused by viruses or bacteria. Only a caregiver can tell which one of these is the likely cause of an illness. A cold is the most common cause of infection in both adults and children. A cold is a virus. Antibiotic treatment will have no effect on a viral infection. Viruses can lead to many lost days of work caring for sick children and many missed days of school. Children may catch as many as 10 "colds" or "flus" per year during which they can be tearful, cranky, and uncomfortable. The goal of treating a virus is aimed at keeping the ill person comfortable. °Antibiotics are medications used to help the body fight bacterial infections. There are relatively few types of bacteria that cause infections but there are hundreds of viruses. While both viruses and bacteria cause infection they are very different types of germs. A viral infection will typically go away by itself within 7 to 10 days. Bacterial infections may spread or get worse without antibiotic treatment. °Examples of bacterial infections are: °· Sore throats (like strep throat or tonsillitis).  °· Infection in the lung (pneumonia).  °· Ear and skin infections.  °Examples of viral infections are: °· Colds or flus.  °· Most coughs and bronchitis.  °· Sore throats not caused by Strep.  °· Runny noses.  °It is often best not to take an antibiotic when a viral infection is the cause of the problem. Antibiotics can kill off the helpful bacteria that we have inside our body and allow harmful bacteria to start growing. Antibiotics can cause side effects such as allergies, nausea, and diarrhea without helping to improve the symptoms of the viral infection. Additionally, repeated uses of antibiotics can cause bacteria inside of our body to become resistant. That resistance can be passed onto harmful bacterial. The next time  you have an infection it may be harder to treat if antibiotics are used when they are not needed. Not treating with antibiotics allows our own immune system to develop and take care of infections more efficiently. Also, antibiotics will work better for us when they are prescribed for bacterial infections. °Treatments for a child that is ill may include: °· Give extra fluids throughout the day to stay hydrated.  °· Get plenty of rest.  °· Only give your child over-the-counter or prescription medicines for pain, discomfort, or fever as directed by your caregiver.  °· The use of a cool mist humidifier may help stuffy noses.  °· Cold medications if suggested by your caregiver.  °Your caregiver may decide to start you on an antibiotic if: °· The problem you were seen for today continues for a longer length of time than expected.  °· You develop a secondary bacterial infection.  °SEEK MEDICAL CARE IF: °· Fever lasts longer than 5 days.  °· Symptoms continue to get worse after 5 to 7 days or become severe.  °· Difficulty in breathing develops.  °· Signs of dehydration develop (poor drinking, rare urinating, dark colored urine).  °· Changes in behavior or worsening tiredness (listlessness or lethargy).  °Document Released: 09/14/2001 Document Revised: 03/18/2011 Document Reviewed: 03/13/2009 °ExitCare® Patient Information ©2012 [employer].Upper Respiratory Infection, Adult °An upper respiratory infection (URI) is also known as the common cold. It is often caused by a type of germ (virus). Colds are easily spread (contagious). You can pass it to others by   kissing, coughing, sneezing, or drinking out of the same glass. Usually, you get better in 1 or 2 weeks.  °HOME CARE  °· Only take medicine as told by your doctor.  °· Use a warm mist humidifier or breathe in steam from a hot shower.  °· Drink enough water and fluids to keep your pee (urine) clear or pale yellow.  °· Get plenty of rest.  °· Return to work when your  temperature is back to normal or as told by your doctor. You may use a face mask and wash your hands to stop your cold from spreading.  °GET HELP RIGHT AWAY IF:  °· After the first few days, you feel you are getting worse.  °· You have questions about your medicine.  °· You have chills, shortness of breath, or brown or red spit (mucus).  °· You have yellow or brown snot (nasal discharge) or pain in the face, especially when you bend forward.  °· You have a fever, puffy (swollen) neck, pain when you swallow, or white spots in the back of your throat.  °· You have a bad headache, ear pain, sinus pain, or chest pain.  °· You have a high-pitched whistling sound when you breathe in and out (wheezing).  °· You have a lasting cough or cough up blood.  °· You have sore muscles or a stiff neck.  °MAKE SURE YOU:  °· Understand these instructions.  °· Will watch your condition.  °· Will get help right away if you are not doing well or get worse.  °Document Released: 12/23/2007 Document Revised: 03/18/2011 Document Reviewed: 11/10/2010 °ExitCare® Patient Information ©2012 [employer]. °

## 2011-09-14 NOTE — ED Notes (Signed)
Pt reports sinus pressure, congestion and headache unrelieved by OTC medications.

## 2011-09-14 NOTE — ED Provider Notes (Signed)
History   Scribed for Hilario Quarry, MD, the patient was seen in MH06/MH06. The chart was scribed by Gilman Schmidt. The patients care was started at 6:14 PM.    CSN: [id number]  Arrival date & time 09/14/11  1712   First MD Initiated Contact with Patient 09/14/11 1740      Chief Complaint  Patient presents with  . Nasal Congestion  . Headache    (Consider location/radiation/quality/duration/timing/severity/associated sxs/prior treatment) HPI Victoria [name] is a 31 y.o. female with a history of Diverticulitis who presents to the Emergency Department complaining of nasal congestion and headache onset four days. Pt reports having sinus symptoms repeatedly. Pt has taken Tylenol Sinus and DayQuil without any relief. Also notes sore throat, chills, cough, and change in appetite. Denies any fever. Pt notes possible sick contact. Pt is on Depo shot. There are no other associated symptoms and no other alleviating or aggravating factors.     Past Medical History  Diagnosis Date  . Diverticulitis     History reviewed. No pertinent past surgical history.  No family history on file.  History  Substance Use Topics  . Smoking status: Never Smoker   . Smokeless tobacco: Never Used  . Alcohol Use: Yes     occasional    OB History    Grav Para Term Preterm Abortions TAB SAB Ect Mult Living   1               Review of Systems  Constitutional: Positive for chills and appetite change. Negative for fever.  HENT: Positive for congestion and sore throat.   Respiratory: Positive for cough.   Neurological: Positive for headaches.  All other systems reviewed and are negative.    Allergies  Review of patient's allergies indicates no known allergies.  Home Medications   Current Outpatient Rx  Name Route Sig Dispense Refill  . MEDROXYPROGESTERONE ACETATE 104 MG/0.65ML [place] SUSP Subcutaneous Inject 104 mg into the skin every 3 (three) months. Dec 2012    . NYQUIL PO Oral Take 1 capsule  by mouth daily as needed. For cold    . PSEUDOEPHEDRINE-ACETAMINOPHEN 30-500 MG PO TABS Oral Take 1 tablet by mouth every 4 (four) hours as needed. For cold      BP 117/71  Pulse 80  Temp(Src) 98.1 F (36.7 C) (Oral)  Resp 16  Ht 5\' 6"  (1.676 m)  Wt 190 lb (86.183 kg)  BMI 30.67 kg/m2  SpO2 100%  Breastfeeding? Unknown  Physical Exam  Constitutional: She appears well-developed and well-nourished.  HENT:  Head: Normocephalic and atraumatic.  Mouth/Throat: Tonsillar abscesses (right tonsillar purulent ) present.  Eyes: Conjunctivae are normal. Pupils are equal, round, and reactive to light.  Neck: Neck supple. No tracheal deviation present. No thyromegaly present.  Cardiovascular: Normal rate and regular rhythm.   No murmur heard. Pulmonary/Chest: Effort normal and breath sounds normal.  Abdominal: Soft. Bowel sounds are normal. She exhibits no distension. There is no tenderness.  Musculoskeletal: Normal range of motion. She exhibits no edema and no tenderness.  Neurological: She is alert. Coordination normal.  Skin: Skin is warm and dry. No rash noted.  Psychiatric: She has a normal mood and affect.    ED Course  Procedures (including critical care time)  Labs Reviewed - No data to display No results found.   No diagnosis found.   DIAGNOSTIC STUDIES: Oxygen Saturation is 100% on room air, normal by my interpretation.    LABS Results for orders placed during  the hospital encounter of 09/14/11  RAPID STREP SCREEN      Component Value Range   Streptococcus, Group A Screen (Direct) NEGATIVE  NEGATIVE     COORDINATION OF CARE: 6:14pm:  - Patient evaluated by ED physician, Rapid Strep ordered    MDM   DIAGNOSTIC STUDIES: Oxygen Saturation is 100% on {room airby my interpretation.             Hilario Quarry, MD 09/14/11 [phone]

## 2011-10-25 ENCOUNTER — Ambulatory Visit: Payer: Managed Care, Other (non HMO) | Admitting: Family Medicine

## 2011-10-25 VITALS — BP 86/70 | HR 105 | Temp 98.0°F | Resp 18 | Ht 66.0 in | Wt 197.2 lb

## 2011-10-25 DIAGNOSIS — E860 Dehydration: Secondary | ICD-10-CM

## 2011-10-25 DIAGNOSIS — R197 Diarrhea, unspecified: Secondary | ICD-10-CM

## 2011-10-25 LAB — COMPREHENSIVE METABOLIC PANEL
ALT: 18 U/L (ref 0–35)
AST: 18 U/L (ref 0–37)
Albumin: 4.4 g/dL (ref 3.5–5.2)
Alkaline Phosphatase: 212 U/L — ABNORMAL HIGH (ref 39–117)
BUN: 10 mg/dL (ref 6–23)
CO2: 18 mEq/L — ABNORMAL LOW (ref 19–32)
Calcium: 9.5 mg/dL (ref 8.4–10.5)
Chloride: 105 mEq/L (ref 96–112)
Creat: 0.83 mg/dL (ref 0.50–1.10)
Glucose, Bld: 67 mg/dL — ABNORMAL LOW (ref 70–99)
Potassium: 3.7 mEq/L (ref 3.5–5.3)
Sodium: 137 mEq/L (ref 135–145)
Total Bilirubin: 0.8 mg/dL (ref 0.3–1.2)
Total Protein: 8.4 g/dL — ABNORMAL HIGH (ref 6.0–8.3)

## 2011-10-25 LAB — POCT UA - MICROSCOPIC ONLY
Bacteria, U Microscopic: NEGATIVE
Casts, Ur, LPF, POC: NEGATIVE
Crystals, Ur, HPF, POC: NEGATIVE
Yeast, UA: NEGATIVE

## 2011-10-25 LAB — POCT CBC
Granulocyte percent: 59.9 %G (ref 37–80)
HCT, POC: 41.4 % (ref 37.7–47.9)
Hemoglobin: 13.7 g/dL (ref 12.2–16.2)
Lymph, poc: 1.6 (ref 0.6–3.4)
MCH, POC: 26.6 pg — AB (ref 27–31.2)
MCHC: 33.1 g/dL (ref 31.8–35.4)
MCV: 80.2 fL (ref 80–97)
MID (cbc): 0.6 (ref 0–0.9)
MPV: 8.7 fL (ref 0–99.8)
POC Granulocyte: 3.4 (ref 2–6.9)
POC LYMPH PERCENT: 28.9 %L (ref 10–50)
POC MID %: 11.2 %M (ref 0–12)
Platelet Count, POC: 308 10*3/uL (ref 142–424)
RBC: 5.16 M/uL (ref 4.04–5.48)
RDW, POC: 16.1 %
WBC: 5.7 10*3/uL (ref 4.6–10.2)

## 2011-10-25 LAB — POCT URINALYSIS DIPSTICK
Glucose, UA: NEGATIVE
Leukocytes, UA: NEGATIVE
Nitrite, UA: NEGATIVE
Spec Grav, UA: >=1.03
Urobilinogen, UA: 0.2
pH, UA: 5

## 2011-10-25 MED ORDER — ONDANSETRON 4 MG PO TBDP: 4.0000 mg | ORAL_TABLET | Freq: Once | ORAL | Status: DC

## 2011-10-25 MED ORDER — LOPERAMIDE HCL 2 MG PO TABS: 2.0000 mg | ORAL_TABLET | Freq: Four times a day (QID) | ORAL | Status: AC | PRN

## 2011-10-25 NOTE — Progress Notes (Signed)
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  She has associated nausea.  She has been taking clear liquids and crackers, but passes liquid stool each time she takes something po.  O:  Alert and cooperative, no icterus, very weak and unsteady on feet Moving 4 extremities equally HEENT:  Dry mm, otherwise normal Chest:  Clear Heart:  Reg, no murmur, rapid Abdomen:  Soft, nontender with hyperactive BS;  No HSM Skin:  Dry with early tenting on dorsal hands Patient significantly improved with 2 hours IV fluids  A:  Presumed norovirus

## 2011-10-25 NOTE — Patient Instructions (Signed)
Diarrhea Infections caused by germs (bacterial) or a virus commonly cause diarrhea. Your caregiver has determined that with time, rest and fluids, the diarrhea should improve. In general, eat normally while drinking more water than usual. Although water may prevent dehydration, it does not contain salt and minerals (electrolytes). Broths, weak tea without caffeine and oral rehydration solutions (ORS) replace fluids and electrolytes. Small amounts of fluids should be taken frequently. Large amounts at one time may not be tolerated. Plain water may be harmful in infants and the elderly. Oral rehydrating solutions (ORS) are available at pharmacies and grocery stores. ORS replace water and important electrolytes in proper proportions. Sports drinks are not as effective as ORS and may be harmful due to sugars worsening diarrhea.  ORS is especially recommended for use in children with diarrhea. As a general guideline for children, replace any new fluid losses from diarrhea and/or vomiting with ORS as follows:   If your child weighs 22 pounds or under (10 kg or less), give 60-120 mL ( -  cup or 2 - 4 ounces) of ORS for each episode of diarrheal stool or vomiting episode.   If your child weighs more than 22 pounds (more than 10 kgs), give 120-240 mL ( - 1 cup or 4 - 8 ounces) of ORS for each diarrheal stool or episode of vomiting.   While correcting for dehydration, children should eat normally. However, foods high in sugar should be avoided because this may worsen diarrhea. Large amounts of carbonated soft drinks, juice, gelatin desserts and other highly sugared drinks should be avoided.   After correction of dehydration, other liquids that are appealing to the child may be added. Children should drink small amounts of fluids frequently and fluids should be increased as tolerated. Children should drink enough fluids to keep urine clear or pale yellow.   Adults should eat normally while drinking more fluids  than usual. Drink small amounts of fluids frequently and increase as tolerated. Drink enough fluids to keep urine clear or pale yellow. Broths, weak decaffeinated tea, lemon lime soft drinks (allowed to go flat) and ORS replace fluids and electrolytes.   Avoid:   Carbonated drinks.   Juice.   Extremely hot or cold fluids.   Caffeine drinks.   Fatty, greasy foods.   Alcohol.   Tobacco.   Too much intake of anything at one time.   Gelatin desserts.   Probiotics are active cultures of beneficial bacteria. They may lessen the amount and number of diarrheal stools in adults. Probiotics can be found in yogurt with active cultures and in supplements.   Wash hands well to avoid spreading bacteria and virus.   Anti-diarrheal medications are not recommended for infants and children.   Only take over-the-counter or prescription medicines for pain, discomfort or fever as directed by your caregiver. Do not give aspirin to children because it may cause Reye's Syndrome.   For adults, ask your caregiver if you should continue all prescribed and over-the-counter medicines.   If your caregiver has given you a follow-up appointment, it is very important to keep that appointment. Not keeping the appointment could result in a chronic or permanent injury, and disability. If there is any problem keeping the appointment, you must call back to this facility for assistance.  SEEK IMMEDIATE MEDICAL CARE IF:   You or your child is unable to keep fluids down or other symptoms or problems become worse in spite of treatment.   Vomiting or diarrhea develops and becomes persistent.     There is vomiting of blood or bile (green material).   There is blood in the stool or the stools are black and tarry.   There is no urine output in 6-8 hours or there is only a small amount of very dark urine.   Abdominal pain develops, increases or localizes.   You have a fever.   Your baby is older than 3 months with a  rectal temperature of 102 F (38.9 C) or higher.   Your baby is 3 months old or younger with a rectal temperature of 100.4 F (38 C) or higher.   You or your child develops excessive weakness, dizziness, fainting or extreme thirst.   You or your child develops a rash, stiff neck, severe headache or become irritable or sleepy and difficult to awaken.  MAKE SURE YOU:   Understand these instructions.   Will watch your condition.   Will get help right away if you are not doing well or get worse.  Document Released: 06/26/2002 Document Revised: 06/25/2011 Document Reviewed: 05/13/2009 Exit[employer] Patient Information 2012 [employer]. 

## 2013-08-02 ENCOUNTER — Telehealth: Payer: Self-pay | Admitting: Genetic Counselor

## 2013-08-02 NOTE — Telephone Encounter (Signed)
S/W PT AND GVE NP APPT 03/16 @ 11:30 W/KAREN POWELL  REFERRING DR. Augusto GambleJODY BOVARD DX- FAM HX OF BREAST CA WELCOME PACKET MAILED.

## 2013-10-02 ENCOUNTER — Encounter: Payer: Managed Care, Other (non HMO) | Admitting: Genetic Counselor

## 2013-10-02 ENCOUNTER — Other Ambulatory Visit: Payer: Managed Care, Other (non HMO)

## 2013-10-19 ENCOUNTER — Telehealth: Payer: Self-pay

## 2013-10-23 NOTE — Telephone Encounter (Signed)
Unable to reach patient or leave voice message.  Line was busy.    Pap- 06/11/09- abnormal Flu Td

## 2013-10-24 ENCOUNTER — Ambulatory Visit: Payer: Managed Care, Other (non HMO) | Admitting: Family Medicine

## 2013-10-24 DIAGNOSIS — Z0289 Encounter for other administrative examinations: Secondary | ICD-10-CM

## 2013-10-24 NOTE — Telephone Encounter (Signed)
No show

## 2014-03-20 ENCOUNTER — Ambulatory Visit: Payer: BC Managed Care – PPO | Admitting: Family

## 2014-03-20 DIAGNOSIS — N6009 Solitary cyst of unspecified breast: Secondary | ICD-10-CM

## 2014-03-20 DIAGNOSIS — Z0289 Encounter for other administrative examinations: Secondary | ICD-10-CM

## 2014-03-20 HISTORY — DX: Solitary cyst of unspecified breast: N60.09

## 2014-03-22 ENCOUNTER — Other Ambulatory Visit: Payer: Self-pay | Admitting: Obstetrics and Gynecology

## 2014-03-22 DIAGNOSIS — N6459 Other signs and symptoms in breast: Secondary | ICD-10-CM

## 2014-03-22 DIAGNOSIS — N644 Mastodynia: Secondary | ICD-10-CM

## 2014-03-29 ENCOUNTER — Ambulatory Visit
Admission: RE | Admit: 2014-03-29 | Discharge: 2014-03-29 | Disposition: A | Discharge status: Home / Self Care | Payer: BC Managed Care – PPO | Source: Ambulatory Visit | Attending: Obstetrics and Gynecology | Admitting: Obstetrics and Gynecology

## 2014-03-29 ENCOUNTER — Encounter (INDEPENDENT_AMBULATORY_CARE_PROVIDER_SITE_OTHER): Payer: Self-pay

## 2014-03-29 DIAGNOSIS — N6459 Other signs and symptoms in breast: Secondary | ICD-10-CM

## 2014-03-29 DIAGNOSIS — N644 Mastodynia: Secondary | ICD-10-CM

## 2014-03-29 IMAGING — US US BREAST*L* LIMITED INC AXILLA
1 series · 4 of 4 positions shown · non-contrast
Comparison: None.

CLINICAL DATA: Left breast pain

EXAM:
DIGITAL DIAGNOSTIC  BILATERAL MAMMOGRAM WITH CAD
ULTRASOUND LEFT BREAST

[Series 1: superficial breast · 4 of 4 slices shown]
[im 1/4]
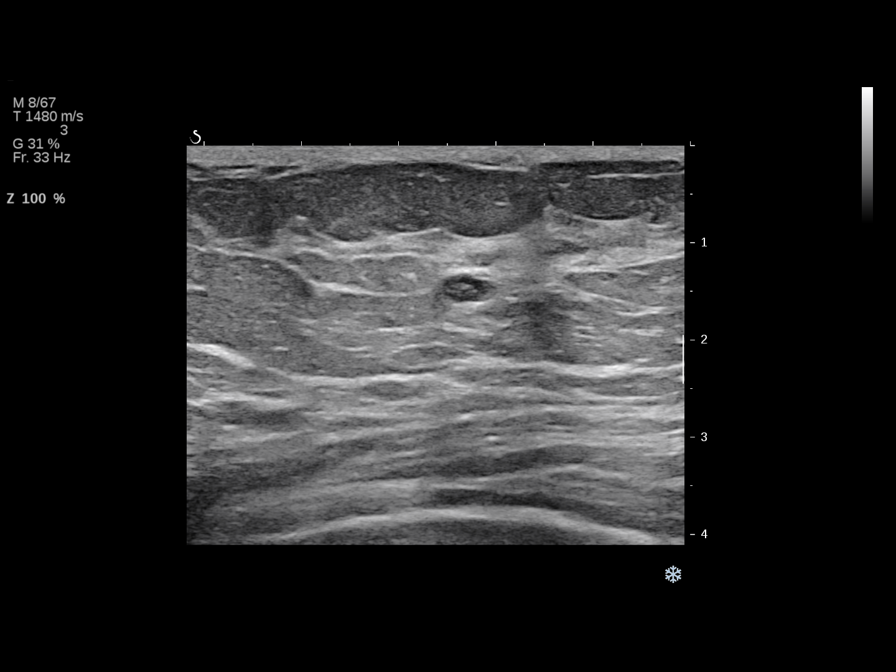
[im 2/4]
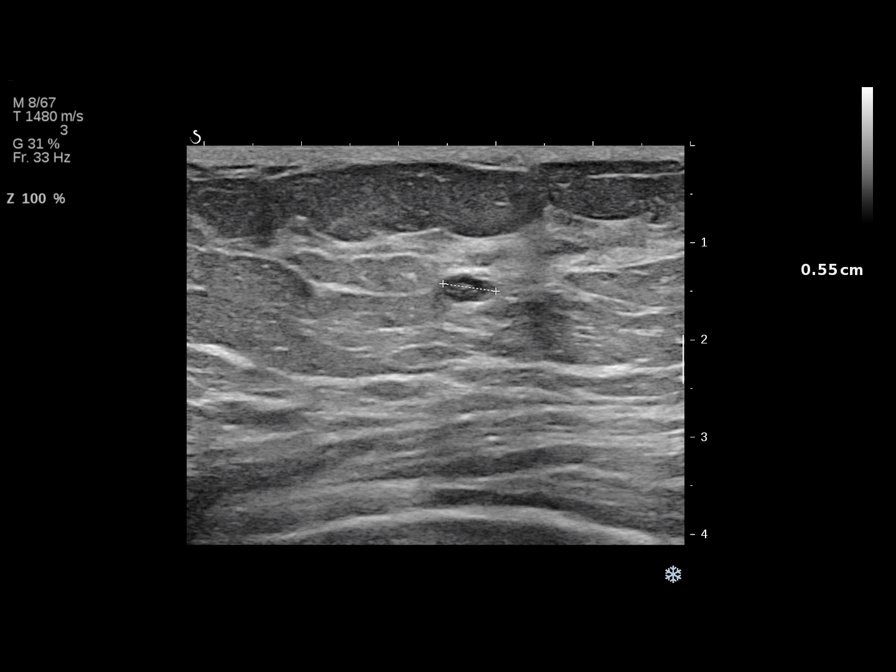
[im 3/4]
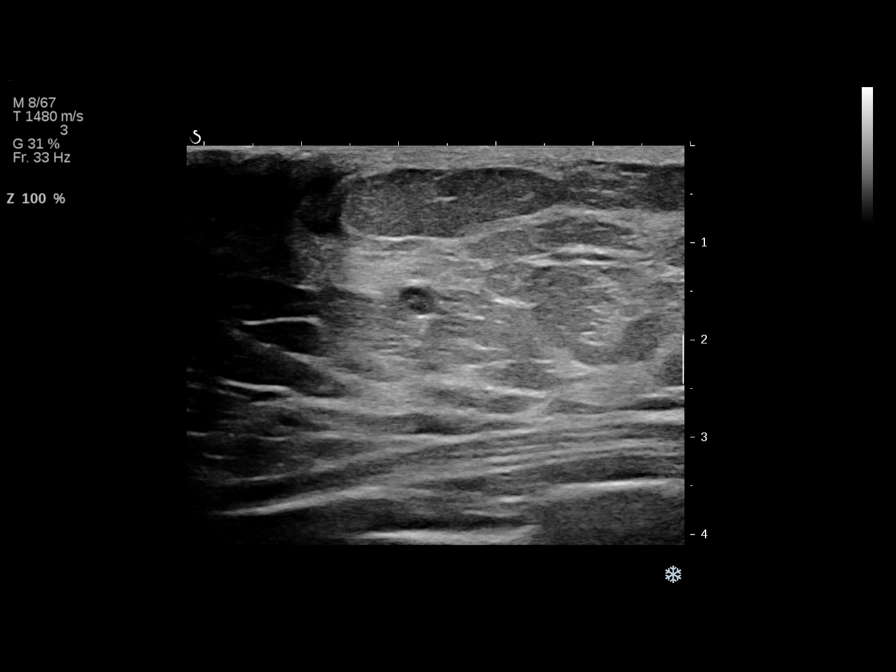
[im 4/4]
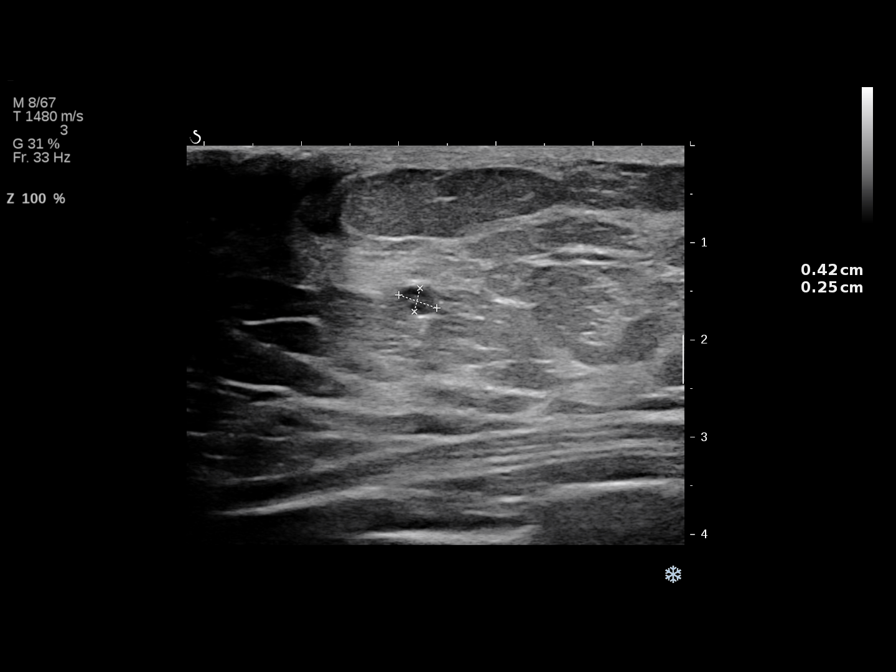

[4 of 4 positions shown; findings below may reference images not displayed]

ACR Breast Density Category b: There are scattered areas of
fibroglandular density.
FINDINGS: On the left, a small oval circumscribed mass lies in the
retroareolar region. There are no other masses. There is no
significant asymmetry. No architectural distortion is noted. There
are no calcifications.

Mammographic images were processed with CAD.

On physical exam, patient is tender in the upper left breast. No
mass is palpable.

Ultrasound is performed, showing a small oval cluster of cysts in
the 12 o'clock position of the left breast, 1 cm from the nipple,
corresponding in size in shape to the mammographic abnormality. It
measures 5.5 mm x 2.5 mm x 4.2 mm. No other abnormalities.
IMPRESSION: Benign small cluster of cysts in the retroareolar left breast. No
evidence of malignancy.

RECOMMENDATION:
Screening mammogram at age 40 unless there are persistent or
intervening clinical concerns. (Code:XI-1-GUM)

I have discussed the findings and recommendations with the patient.
Results were also provided in writing at the conclusion of the
visit. If applicable, a reminder letter will be sent to the patient
regarding the next appointment.

BI-RADS CATEGORY  2: Benign.

## 2014-03-29 IMAGING — MG MM DIAGNOSTIC BILATERAL
4 series · 4 of 4 positions shown · non-contrast
Comparison: None.

CLINICAL DATA: Left breast pain

EXAM:
DIGITAL DIAGNOSTIC  BILATERAL MAMMOGRAM WITH CAD
ULTRASOUND LEFT BREAST

[R CC]
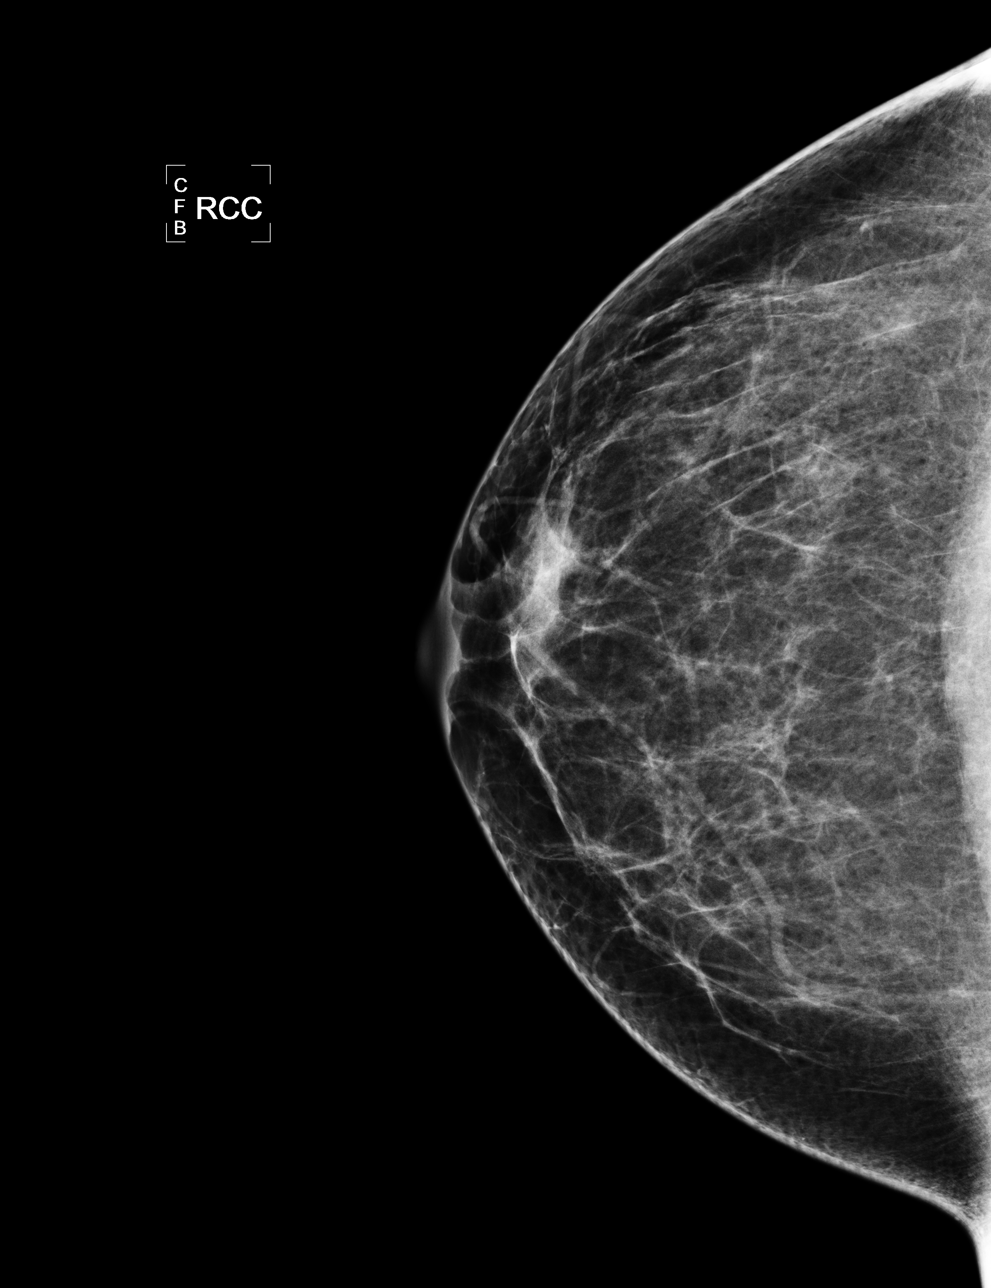

[L CC]
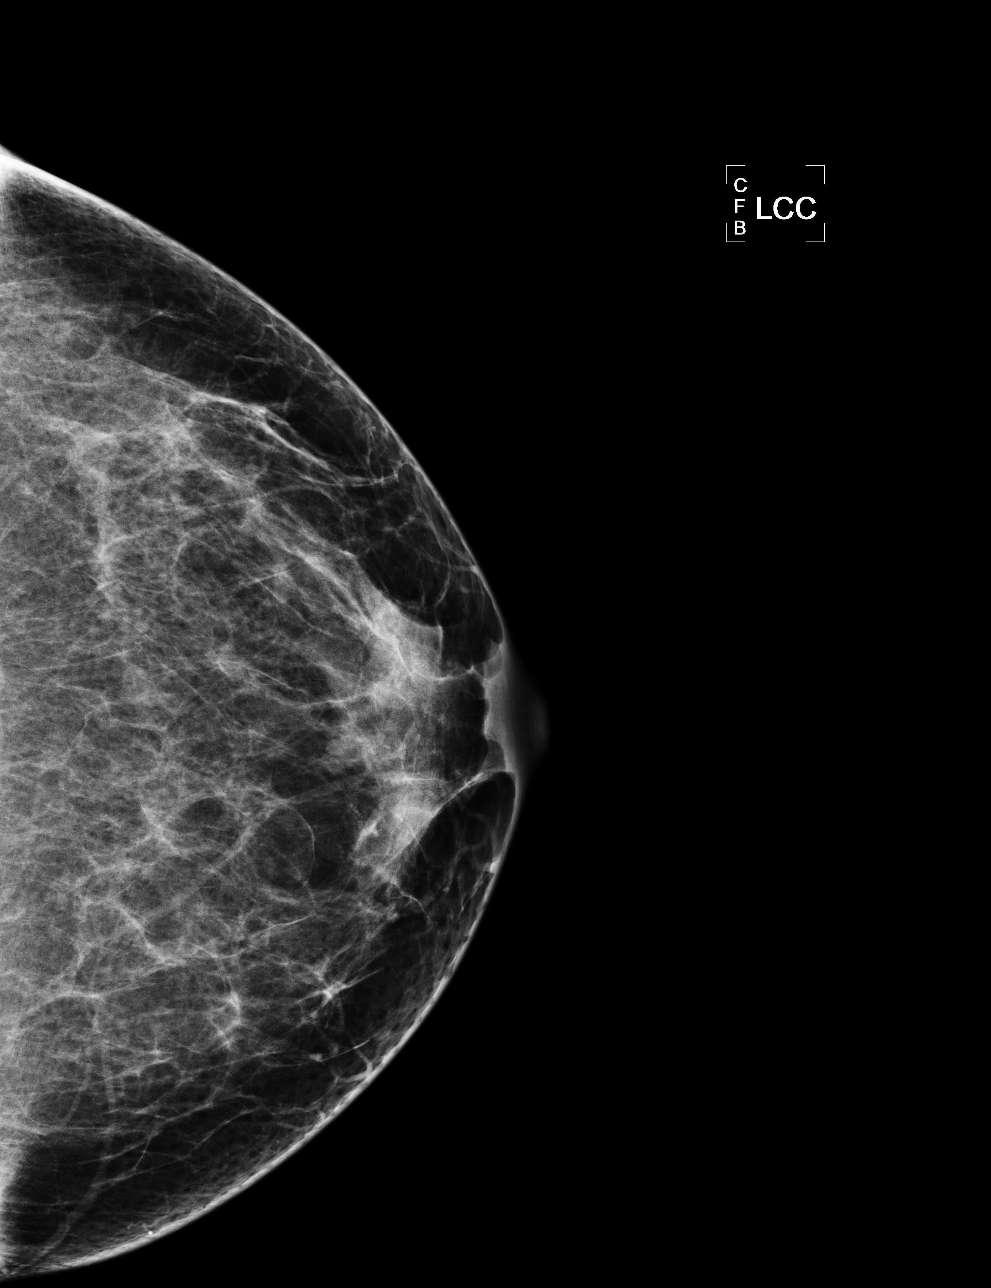

[L MLO]
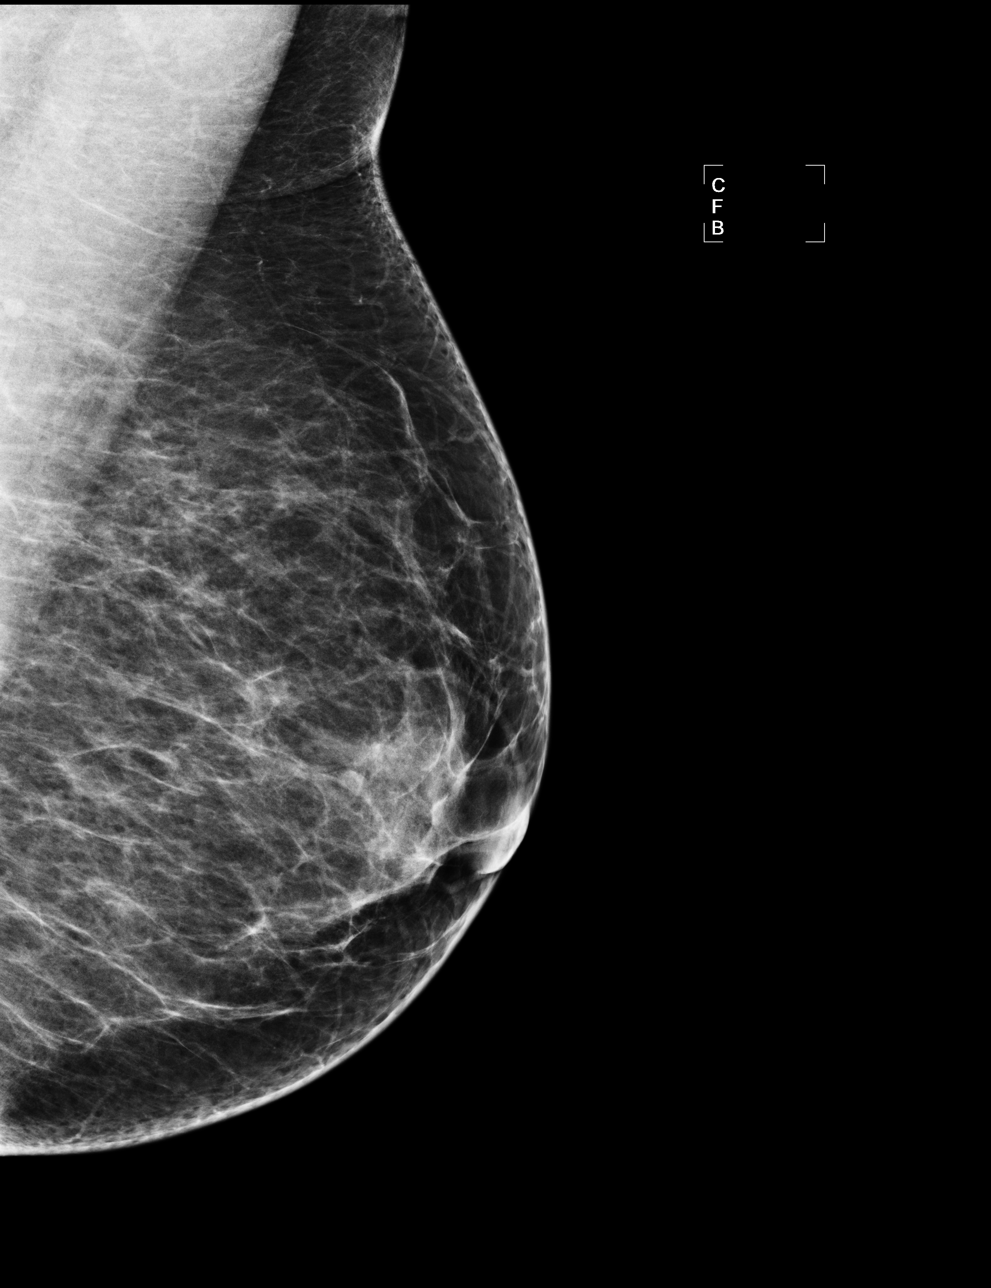

[R MLO]
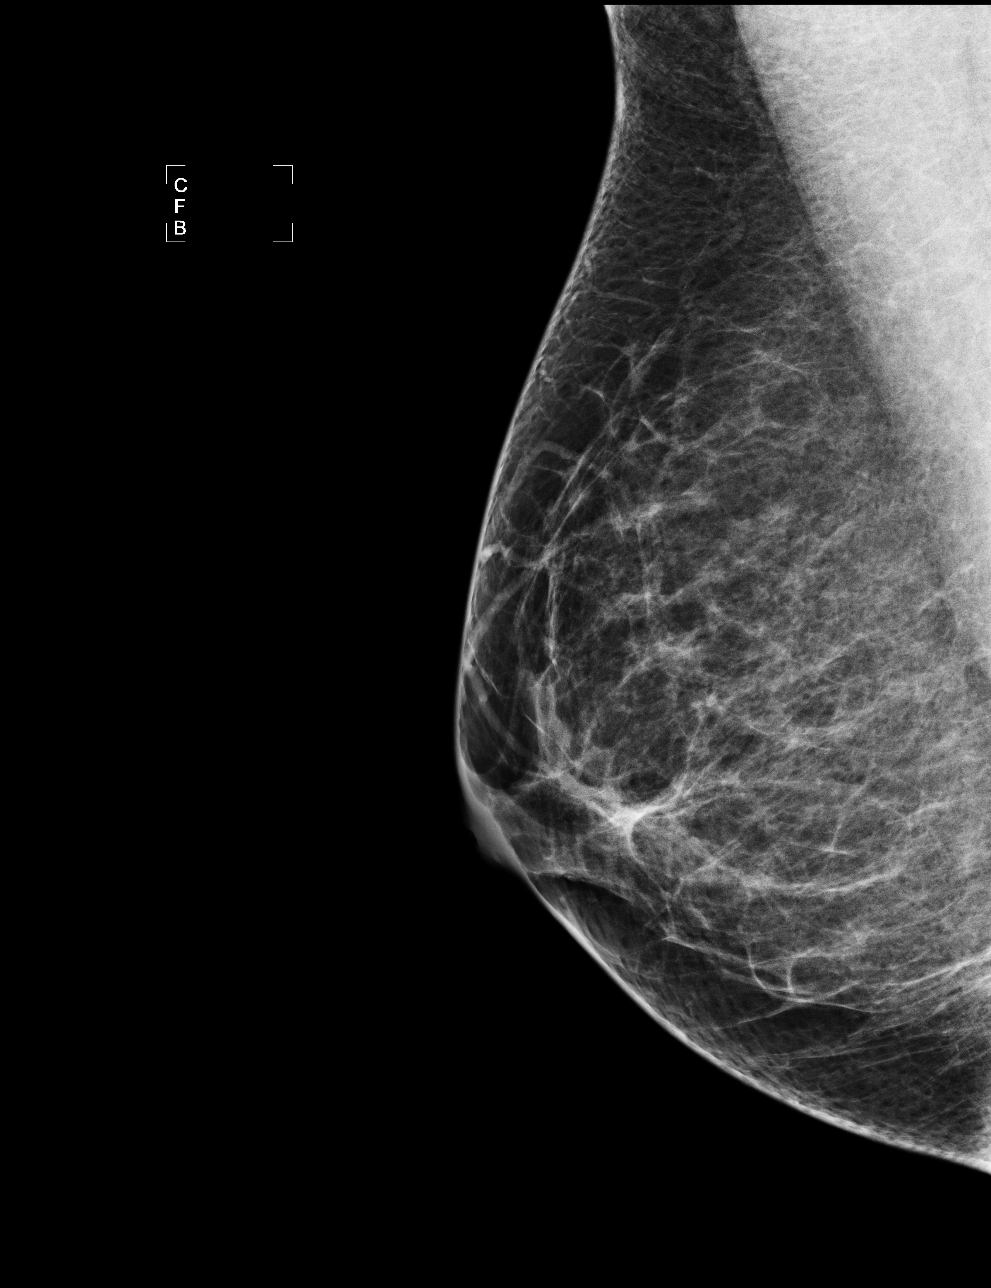

[4 of 4 positions shown; findings below may reference images not displayed]

ACR Breast Density Category b: There are scattered areas of
fibroglandular density.
FINDINGS: On the left, a small oval circumscribed mass lies in the
retroareolar region. There are no other masses. There is no
significant asymmetry. No architectural distortion is noted. There
are no calcifications.

Mammographic images were processed with CAD.

On physical exam, patient is tender in the upper left breast. No
mass is palpable.

Ultrasound is performed, showing a small oval cluster of cysts in
the 12 o'clock position of the left breast, 1 cm from the nipple,
corresponding in size in shape to the mammographic abnormality. It
measures 5.5 mm x 2.5 mm x 4.2 mm. No other abnormalities.
IMPRESSION: Benign small cluster of cysts in the retroareolar left breast. No
evidence of malignancy.

RECOMMENDATION:
Screening mammogram at age 40 unless there are persistent or
intervening clinical concerns. (Code:XI-1-GUM)

I have discussed the findings and recommendations with the patient.
Results were also provided in writing at the conclusion of the
visit. If applicable, a reminder letter will be sent to the patient
regarding the next appointment.

BI-RADS CATEGORY  2: Benign.

## 2014-04-03 ENCOUNTER — Encounter: Payer: Self-pay | Admitting: Family

## 2014-04-03 ENCOUNTER — Ambulatory Visit (INDEPENDENT_AMBULATORY_CARE_PROVIDER_SITE_OTHER): Payer: BC Managed Care – PPO | Admitting: Family

## 2014-04-03 VITALS — BP 109/72 | HR 85 | Temp 98.7°F | Resp 16 | Wt 190.0 lb

## 2014-04-03 DIAGNOSIS — Z0000 Encounter for general adult medical examination without abnormal findings: Secondary | ICD-10-CM

## 2014-04-03 NOTE — Assessment & Plan Note (Signed)
Encouraged her to continue healthy diet, exercise, weight loss. Obtain non-fasting labs.

## 2014-04-03 NOTE — Patient Instructions (Addendum)
Please complete lab work prior to leaving. Keep up the good work with healthy diet, exercise, weight loss.         Assessment & Plan:

## 2014-04-03 NOTE — Progress Notes (Signed)
Subjective:    Patient ID: Victoria [name], female    DOB: 18-Feb-1981, 33 y.o.   MRN: [id number]  HPI  Patient presents today for complete physical. She has not been seen at our office since 2011.   Immunizations: declines flu shot.  Due for tetanus Diet: reports healthy diet. Exercise: has been exercising 3x a week.  Has lost 15 pounds this year Pap Smear:  1/14- Dr. Elmon Kirschner GYN Mammogram: [facility] mamm[facility]  Will start OCP per GYN    Review of Systems  Constitutional: Negative for unexpected weight change.  HENT: Positive for rhinorrhea. Negative for hearing loss.   Eyes: Negative for visual disturbance.  Respiratory: Negative for cough and shortness of breath.   Cardiovascular: Negative for chest pain and leg swelling.  Gastrointestinal: Negative for nausea, vomiting and diarrhea.  Genitourinary: Negative for dysuria, frequency and menstrual problem.  Musculoskeletal:       L knee pain x 2 weeks.  Skin: Negative for rash.  Neurological: Negative for headaches.  Hematological: Negative for adenopathy.  Psychiatric/Behavioral:       Denies depression/anxiety   Past Medical History  Diagnosis Date  . Diverticulitis   . Breast cyst 03/2014    left breast, benign    History   Social History  . Marital Status: Married    Spouse Name: N/A    Number of Children: N/A  . Years of Education: N/A   Occupational History  . Not on file.   Social History Main Topics  . Smoking status: Never Smoker   . Smokeless tobacco: Never Used  . Alcohol Use: Yes     Comment: occasional  . Drug Use: No  . Sexual Activity: Not on file   Other Topics Concern  . 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 No pertinent past  surgical history.  Family History  Problem Relation Age of Onset  . Hypertension Mother   . Coronary artery disease Mother 71    carciac stent placement  . Hypertension Father   . Breast cancer Maternal Grandmother 3  . Breast cancer Maternal Aunt     3 aunts with breast CA- diagnosed in their 63's    No Known Allergies  No current outpatient prescriptions on file prior to visit.   No current facility-administered medications on file prior to visit.    BP 109/72  Pulse 85  Temp(Src) 98.7 F (37.1 C) (Oral)  Resp 16  Wt 190 lb (86.183 kg)  SpO2 99%  LMP 03/08/2014       Objective:   Physical Exam  Physical Exam  Constitutional: She is oriented to person, place, and time. She appears well-developed and well-nourished. No distress.  HENT:  Head: Normocephalic and atraumatic.  Right Ear: Tympanic membrane and ear canal normal.  Left Ear: Tympanic membrane and ear canal normal.  Mouth/Throat: Oropharynx is clear and moist.  Eyes: Pupils are equal, round, and reactive to light. No scleral icterus.  Neck: Normal range of motion. No thyromegaly present.  Cardiovascular: Normal rate and regular rhythm.   No murmur heard. Pulmonary/Chest: Effort normal and breath sounds normal. No respiratory distress. He has no  wheezes. She has no rales. She exhibits no tenderness.  Abdominal: Soft. Bowel sounds are normal. He exhibits no distension and no mass. There is no tenderness. There is no rebound and no guarding.  Musculoskeletal: She exhibits no edema.  Lymphadenopathy:    She has no cervical adenopathy.  Neurological: She is alert and oriented to person, place, and time. She has normal reflexes. She exhibits normal muscle tone. Coordination normal.  Skin: Skin is warm and dry.  Psychiatric: She has a normal mood and affect. Her behavior is normal. Judgment and thought content normal.  Breast/pelvic: deferred to GYN      Assessment & Plan:         Assessment & Plan:

## 2014-04-03 NOTE — Progress Notes (Signed)
Pre visit review using our clinic review tool, if applicable. No additional management support is needed unless otherwise documented below in the visit note. 

## 2014-04-04 LAB — URINALYSIS, ROUTINE W REFLEX MICROSCOPIC
BILIRUBIN URINE: NEGATIVE
LEUKOCYTES UA: NEGATIVE
Nitrite: NEGATIVE
PH: 6 (ref 5.0–8.0)
Specific Gravity, Urine: 1.025 (ref 1.000–1.030)
UROBILINOGEN UA: 0.2 (ref 0.0–1.0)
Urine Glucose: NEGATIVE

## 2014-04-04 LAB — HIV ANTIBODY (ROUTINE TESTING W REFLEX): HIV 1&2 Ab, 4th Generation: NONREACTIVE

## 2014-04-05 ENCOUNTER — Telehealth: Payer: Self-pay | Admitting: Family

## 2014-04-05 DIAGNOSIS — R779 Abnormality of plasma protein, unspecified: Secondary | ICD-10-CM | POA: Insufficient documentation

## 2014-04-05 DIAGNOSIS — R748 Abnormal levels of other serum enzymes: Secondary | ICD-10-CM | POA: Insufficient documentation

## 2014-04-05 LAB — BASIC METABOLIC PANEL
BUN: 11 mg/dL (ref 6–23)
CALCIUM: 8.9 mg/dL (ref 8.4–10.5)
CO2: 25 meq/L (ref 19–32)
Chloride: 103 mEq/L (ref 96–112)
Creatinine, Ser: 0.7 mg/dL (ref 0.4–1.2)
GFR: 122.97 mL/min (ref >60.00)
Glucose, Bld: 65 mg/dL — ABNORMAL LOW (ref 70–99)
POTASSIUM: 3.9 meq/L (ref 3.5–5.1)
SODIUM: 138 meq/L (ref 135–145)

## 2014-04-05 LAB — CBC WITH DIFFERENTIAL/PLATELET
BASOS PCT: 0.4 % (ref 0.0–3.0)
Basophils Absolute: 0 10*3/uL (ref 0.0–0.1)
Eosinophils Absolute: 0.2 10*3/uL (ref 0.0–0.7)
Eosinophils Relative: 3.1 % (ref 0.0–5.0)
HEMATOCRIT: 36.2 % (ref 36.0–46.0)
HEMOGLOBIN: 12.1 g/dL (ref 12.0–15.0)
LYMPHS PCT: 31.5 % (ref 12.0–46.0)
Lymphs Abs: 2.5 10*3/uL (ref 0.7–4.0)
MCHC: 33.6 g/dL (ref 30.0–36.0)
MCV: 84.7 fl (ref 78.0–100.0)
MONOS PCT: 8.6 % (ref 3.0–12.0)
Monocytes Absolute: 0.7 10*3/uL (ref 0.1–1.0)
NEUTROS ABS: 4.5 10*3/uL (ref 1.4–7.7)
Neutrophils Relative %: 56.4 % (ref 43.0–77.0)
Platelets: 294 10*3/uL (ref 150.0–400.0)
RBC: 4.27 Mil/uL (ref 3.87–5.11)
RDW: 15.8 % — ABNORMAL HIGH (ref 11.5–15.5)
WBC: 8.1 10*3/uL (ref 4.0–10.5)

## 2014-04-05 LAB — TSH: TSH: 0.35 u[IU]/mL (ref 0.35–4.50)

## 2014-04-05 LAB — HEPATIC FUNCTION PANEL
ALT: 17 U/L (ref 0–35)
AST: 20 U/L (ref 0–37)
Albumin: 4 g/dL (ref 3.5–5.2)
Alkaline Phosphatase: 132 U/L — ABNORMAL HIGH (ref 39–117)
Bilirubin, Direct: 0.1 mg/dL (ref 0.0–0.3)
Total Bilirubin: 0.9 mg/dL (ref 0.2–1.2)
Total Protein: 8.7 g/dL — ABNORMAL HIGH (ref 6.0–8.3)

## 2014-04-05 LAB — LIPID PANEL
Cholesterol: 144 mg/dL (ref 0–200)
HDL: 52.1 mg/dL (ref >39.00)
LDL Cholesterol: 82 mg/dL (ref 0–99)
NonHDL: 91.9
Total CHOL/HDL Ratio: 3
Triglycerides: 50 mg/dL (ref 0.0–149.0)
VLDL: 10 mg/dL (ref 0.0–40.0)

## 2014-04-05 NOTE — Telephone Encounter (Signed)
Protein in blood a little high, I would like her to complete some additional lab testing to further evaluate please. Also alk phos- one of the liver tests is elevated. Can sometimes be due to gallbladder. I would like her to complete an abdominal ultrasound.  Also HIV screen negative, Kidneys, sugar, blood count, cholesterol all ok.

## 2014-04-06 ENCOUNTER — Other Ambulatory Visit (INDEPENDENT_AMBULATORY_CARE_PROVIDER_SITE_OTHER): Payer: BC Managed Care – PPO

## 2014-04-06 DIAGNOSIS — R779 Abnormality of plasma protein, unspecified: Secondary | ICD-10-CM

## 2014-04-06 DIAGNOSIS — R799 Abnormal finding of blood chemistry, unspecified: Secondary | ICD-10-CM

## 2014-04-06 DIAGNOSIS — R748 Abnormal levels of other serum enzymes: Secondary | ICD-10-CM

## 2014-04-06 LAB — GAMMA GT: GGT: 18 U/L (ref 7–51)

## 2014-04-06 NOTE — Telephone Encounter (Signed)
Notified pt and she voices understanding. Lab appt scheduled for today at 1:30pm.

## 2014-04-07 ENCOUNTER — Ambulatory Visit (HOSPITAL_BASED_OUTPATIENT_CLINIC_OR_DEPARTMENT_OTHER): Payer: BC Managed Care – PPO

## 2014-04-10 ENCOUNTER — Other Ambulatory Visit: Payer: Self-pay | Admitting: *Deleted

## 2014-04-10 DIAGNOSIS — R799 Abnormal finding of blood chemistry, unspecified: Secondary | ICD-10-CM

## 2014-04-10 LAB — PROTEIN ELECTROPHORESIS, SERUM
ALBUMIN ELP: 48.9 % — AB (ref 55.8–66.1)
ALPHA-1-GLOBULIN: 4.5 % (ref 2.9–4.9)
ALPHA-2-GLOBULIN: 10.6 % (ref 7.1–11.8)
Beta 2: 5.3 % (ref 3.2–6.5)
Beta Globulin: 6.6 % (ref 4.7–7.2)
GAMMA GLOBULIN: 24.1 % — AB (ref 11.1–18.8)
Total Protein, Serum Electrophoresis: 7.4 g/dL (ref 6.0–8.3)

## 2014-04-10 NOTE — Addendum Note (Signed)
Addended by: Verdie Shire on: 04/10/2014 03:13 PM   Modules accepted: Orders

## 2014-04-12 ENCOUNTER — Telehealth: Payer: Self-pay | Admitting: Family

## 2014-04-12 DIAGNOSIS — R779 Abnormality of plasma protein, unspecified: Secondary | ICD-10-CM

## 2014-04-12 LAB — PROTEIN ELECTROPHORESIS, URINE REFLEX
ALPHA-2-GLOBULIN, U: 15.7 %
Albumin: 37.9 %
Alpha-1-Globulin, U: 29.5 %
BETA GLOBULIN, U: 9.9 %
GAMMA GLOBULIN, U: 7 %
Total Protein, Urine: 17 mg/dL

## 2014-04-12 NOTE — Telephone Encounter (Signed)
Please contact pt and let her know that I would like for her to meet with hematology for further evaluation due to increase protein level in her blood.

## 2014-04-13 ENCOUNTER — Telehealth: Payer: Self-pay

## 2014-04-13 ENCOUNTER — Ambulatory Visit (HOSPITAL_BASED_OUTPATIENT_CLINIC_OR_DEPARTMENT_OTHER)
Admission: RE | Admit: 2014-04-13 | Discharge: 2014-04-13 | Disposition: A | Discharge status: Home / Self Care | Payer: BC Managed Care – PPO | Source: Ambulatory Visit | Attending: Family | Admitting: Family

## 2014-04-13 DIAGNOSIS — R748 Abnormal levels of other serum enzymes: Secondary | ICD-10-CM | POA: Insufficient documentation

## 2014-04-13 IMAGING — US US ABDOMEN COMPLETE
1 series · 14 of 25 positions shown · non-contrast
Comparison: None.

CLINICAL DATA: Elevated alkaline phosphatase.

EXAM:
ULTRASOUND ABDOMEN COMPLETE

[Series 1: us abdomen complete · 0.25mm/px · 14 of 62 slices shown]
[im 1/62]
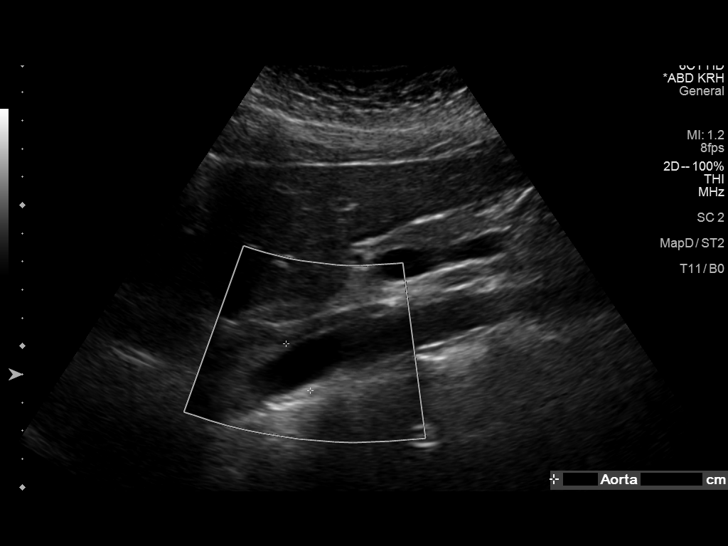
[im 6/62]
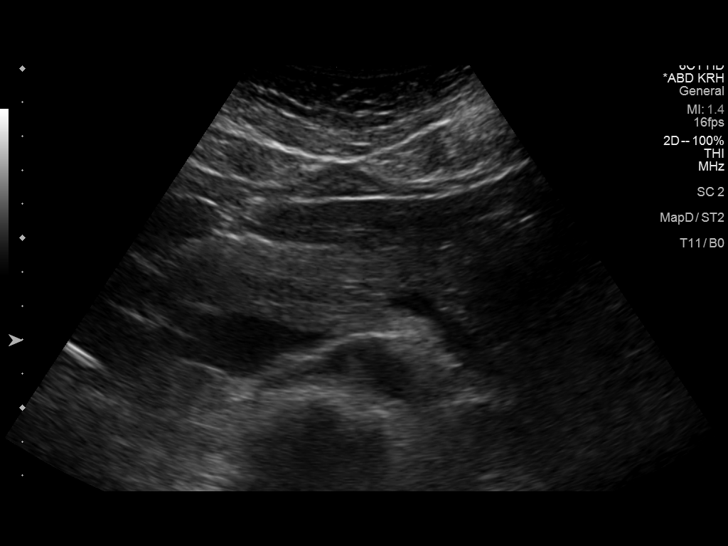
[im 11/62]
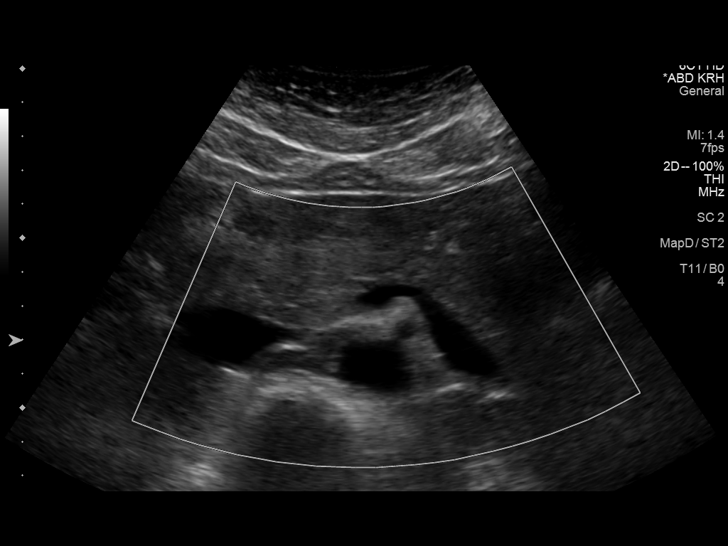
[im 16/62]
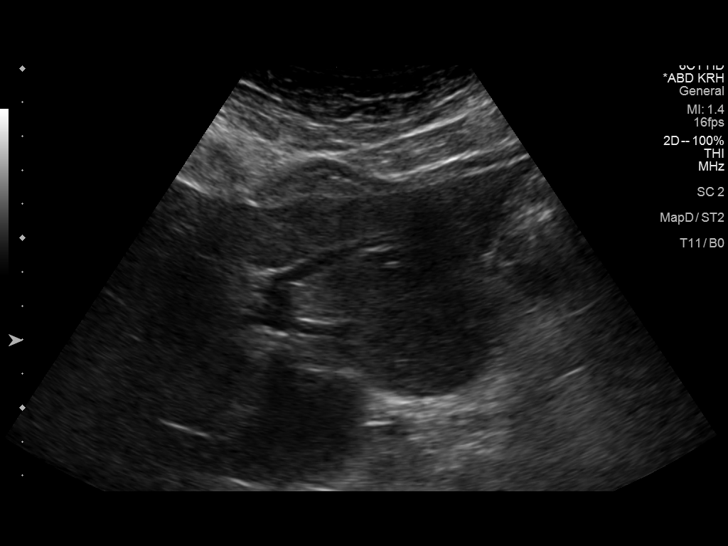
[im 21/62]
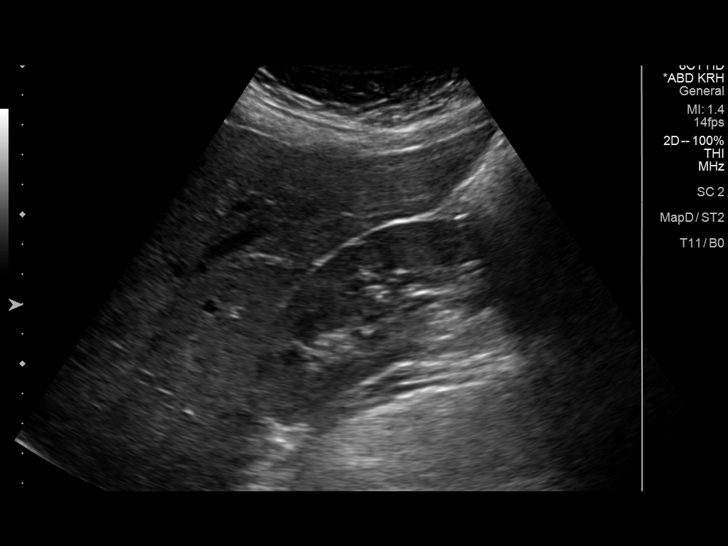
[im 23/62]
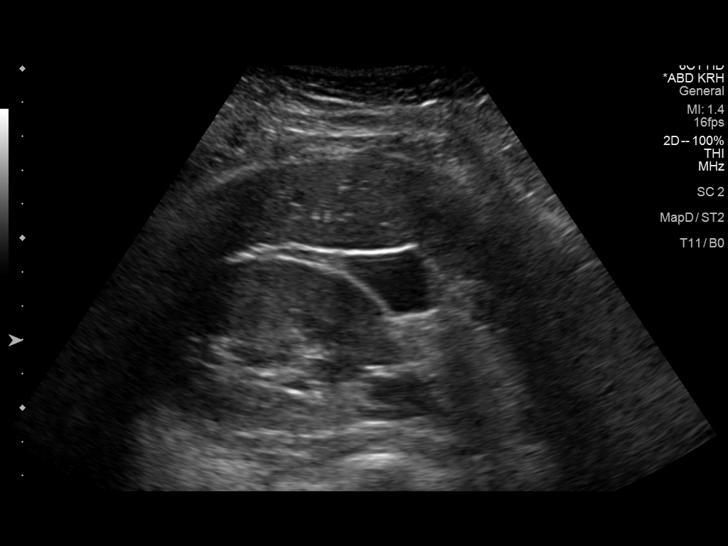
[im 28/62]
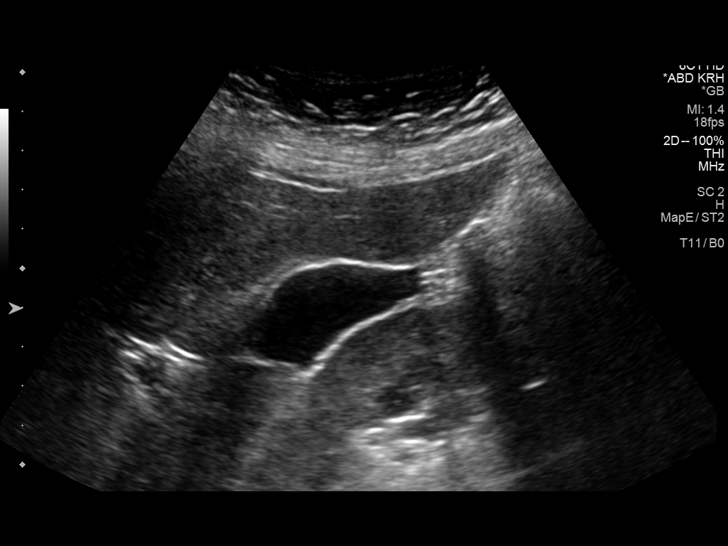
[im 34/62]
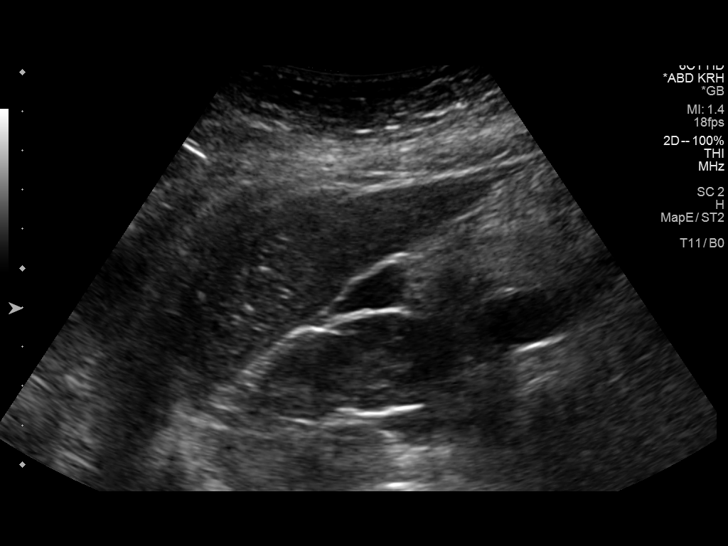
[im 39/62]
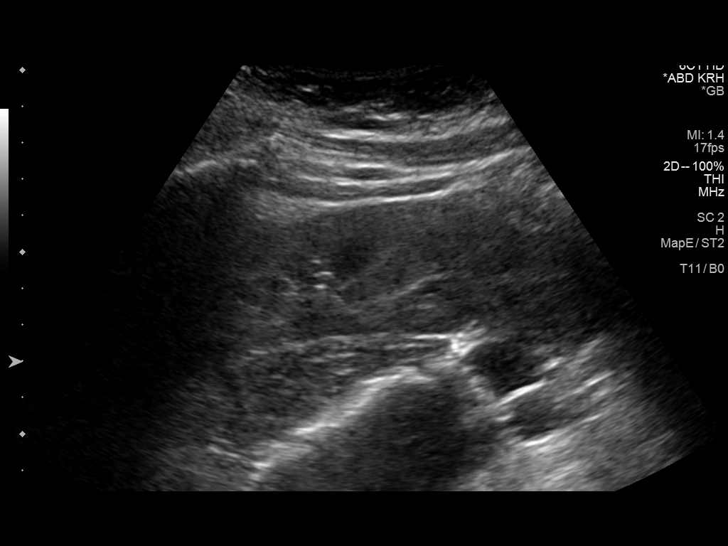
[im 41/62]
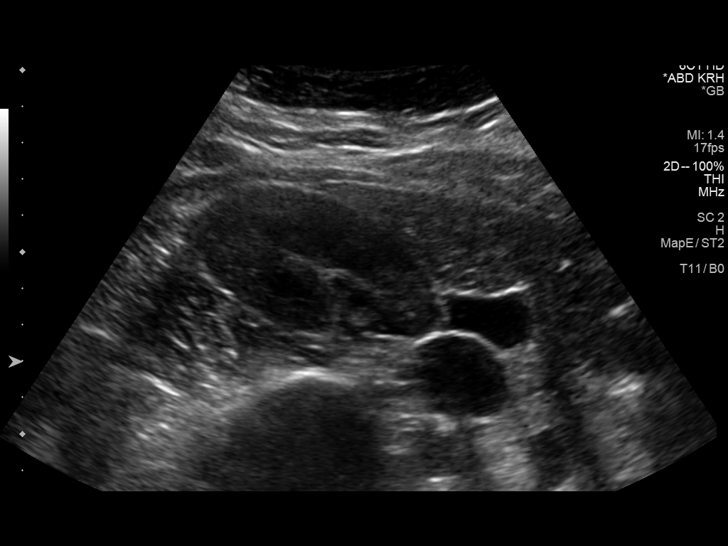
[im 46/62]
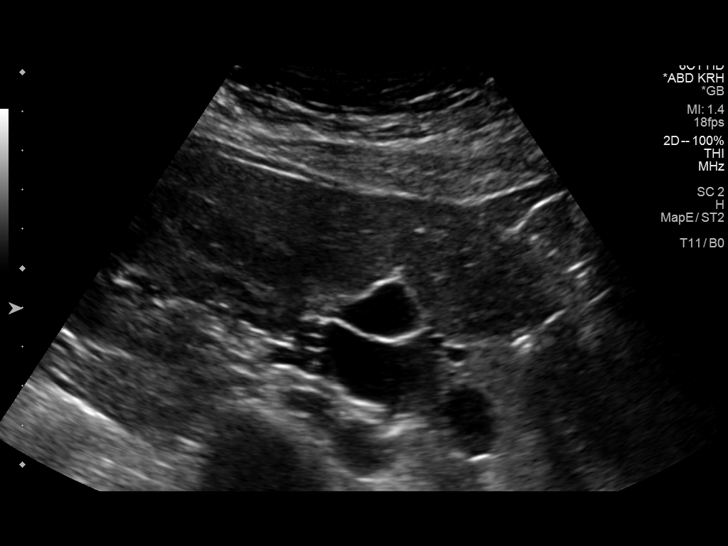
[im 51/62]
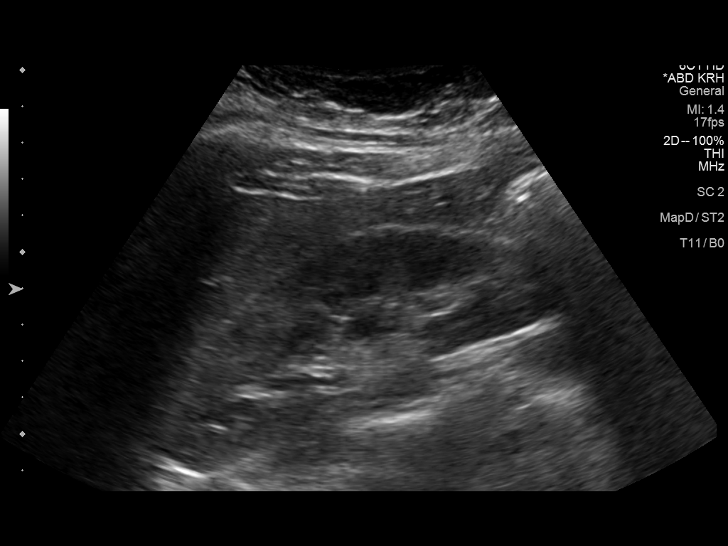
[im 56/62]
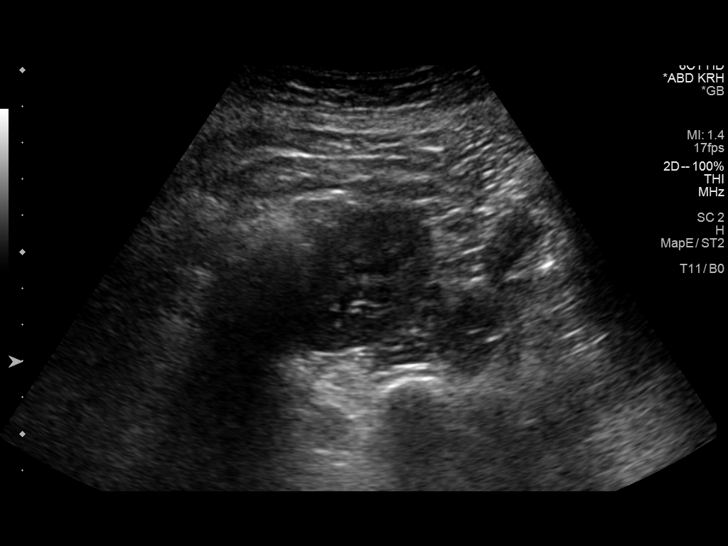
[im 62/62]
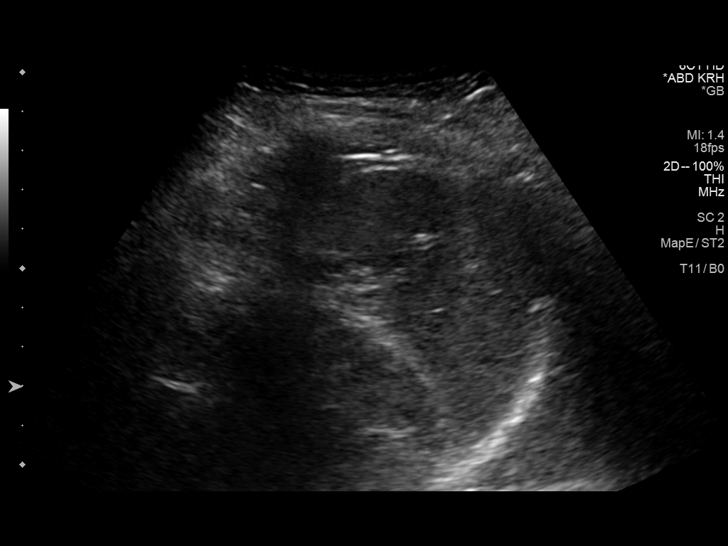

[14 of 25 positions shown; findings below may reference images not displayed]

FINDINGS: Gallbladder:

No gallstones or wall thickening visualized. No sonographic Murphy
sign noted.

Common bile duct:

Diameter: 2 mm in diameter.

Liver:

No focal lesion identified. Within normal limits in parenchymal
echogenicity.

IVC:

No abnormality visualized.

Pancreas:

Visualized portion unremarkable.

Spleen:

Size and appearance within normal limits.

Right Kidney:

Length: 12.0 cm. Echogenicity within normal limits. No mass or
hydronephrosis visualized.

Left Kidney:

Length: 10.3 cm. Echogenicity within normal limits. No mass or
hydronephrosis visualized.

Abdominal aorta:

No aneurysm visualized.

Other findings:

None.
IMPRESSION: Unremarkable abdominal ultrasound.

## 2014-04-13 NOTE — Telephone Encounter (Signed)
Victoria [name] Self [phone]  Lainie returned your call

## 2014-04-13 NOTE — Telephone Encounter (Signed)
See u/s result note. 

## 2014-04-13 NOTE — Telephone Encounter (Signed)
Notified pt and she is agreeable to proceed with referral. 

## 2014-05-21 ENCOUNTER — Encounter: Payer: Self-pay | Admitting: Family

## 2019-08-15 ENCOUNTER — Encounter (INDEPENDENT_AMBULATORY_CARE_PROVIDER_SITE_OTHER): Payer: BC Managed Care – PPO | Admitting: Neurology

## 2019-08-15 ENCOUNTER — Other Ambulatory Visit: Payer: Self-pay

## 2019-08-15 ENCOUNTER — Ambulatory Visit: Payer: BC Managed Care – PPO | Admitting: Neurology

## 2019-08-15 DIAGNOSIS — M79602 Pain in left arm: Secondary | ICD-10-CM | POA: Diagnosis not present

## 2019-08-15 DIAGNOSIS — Z0289 Encounter for other administrative examinations: Secondary | ICD-10-CM

## 2019-08-15 DIAGNOSIS — M542 Cervicalgia: Secondary | ICD-10-CM

## 2019-08-15 NOTE — Progress Notes (Signed)
Full Name: Victoria [name] Gender: Female MRN #: [id number] Date of Birth: 1980-08-30    Visit Date: 08/15/2019 07:21 Age: 39 Years Examining Physician: Arlice Colt, MD  Referring Physician: Erline Levine, MD  History: Victoria [name] is a 39 year old woman with left-sided neck and arm pain.  The pain is mostly in the neck and shoulder but will radiate to the hand at times.  On exam, strength was normal in the arm.  MRI of the cervical spine had shown some degenerative changes in the mid cervical spine, worse at C4-C5 towards the right.  There is no nerve root compression.  Nerve conduction studies: Bilateral median and ulnar motor responses had normal distal latencies, amplitudes and conduction velocities.  Bilateral median and ulnar sensory responses had normal peak latencies and amplitudes.  Bilateral ulnar F-wave responses had normal latencies.  EMG: Needle EMG of selected muscles of the left arm and shoulder was performed.  Motor unit morphology and recruitment was normal in all of the muscles tested.  There was no abnormal spontaneous activity.  Impression: This is a normal nerve conduction study of the arms and EMG of the left arm.  There is no evidence of mononeuropathy or significant radiculopathy.  Victoria [name] A. Felecia Shelling, MD, PhD, FAAN Certified in Neurology, Clinical Neurophysiology, Sleep Medicine, Pain Medicine and Neuroimaging Director, [facility] [address] [place], [place] [address] [phone]      [facility]    Nerve / Sites Muscle Latency Ref. Amplitude Ref. Rel Amp Segments Distance Velocity Ref. Area    ms ms mV mV %  cm m/s m/s mVms  L Median - APB     Wrist APB 3.1 ?4.4 9.0 ?4.0 100 Wrist - APB 7   36.5     Upper arm APB 7.0  9.1  101 Upper arm - Wrist 25 63 ?49 33.6  R Median - APB     Wrist APB 3.1 ?4.4 10.8 ?4.0 100 Wrist - APB 7   36.3     Upper arm APB 7.2  10.6  98.3  Upper arm - Wrist 25 61 ?49 35.8  L Ulnar - ADM     Wrist ADM 2.1 ?3.3 11.4 ?6.0 100 Wrist - ADM 7   38.7     B.Elbow ADM 5.5  10.8  94.5 B.Elbow - Wrist 23 69 ?49 38.9     A.Elbow ADM 7.0  10.5  97.1 A.Elbow - B.Elbow 10 65 ?49 38.0         A.Elbow - Wrist      R Ulnar - ADM     Wrist ADM 2.1 ?3.3 12.1 ?6.0 100 Wrist - ADM 7   37.5     B.Elbow ADM 5.9  11.2  92.3 B.Elbow - Wrist 23 61 ?49 36.2     A.Elbow ADM 7.5  11.1  99.1 A.Elbow - B.Elbow 10 62 ?49 36.1         A.Elbow - Wrist                 SNC    Nerve / Sites Rec. Site Peak Lat Ref.  Amp Ref. Segments Distance Peak Diff Ref.    ms ms V V  cm ms ms  L Median, Ulnar - Transcarpal comparison     Median Palm Wrist 1.7 ?2.2 111 ?35 Median Palm - Wrist 8       Ulnar Palm Wrist  1.6 ?2.2 36 ?12 Ulnar Palm - Wrist 8          Median Palm - Ulnar Palm  0.1 ?0.4  R Median, Ulnar - Transcarpal comparison     Median Palm Wrist 1.8 ?2.2 100 ?35 Median Palm - Wrist 8       Ulnar Palm Wrist 1.8 ?2.2 46 ?12 Ulnar Palm - Wrist 8          Median Palm - Ulnar Palm  0.0 ?0.4  L Median - Orthodromic (Dig II, Mid palm)     Dig II Wrist 2.7 ?3.4 17 ?10 Dig II - Wrist 13    R Median - Orthodromic (Dig II, Mid palm)     Dig II Wrist 2.8 ?3.4 14 ?10 Dig II - Wrist 13    L Ulnar - Orthodromic, (Dig V, Mid palm)     Dig V Wrist 2.4 ?3.1 8 ?5 Dig V - Wrist 11    R Ulnar - Orthodromic, (Dig V, Mid palm)     Dig V Wrist 2.4 ?3.1 10 ?5 Dig V - Wrist 23                   F  Wave    Nerve F Lat Ref.   ms ms  L Ulnar - ADM 25.9 ?32.0  R Ulnar - ADM 27.7 ?32.0         EMG Summary Table    Spontaneous MUAP Recruitment  Muscle IA Fib PSW Fasc Other Amp Dur. Poly Pattern  L. Deltoid Normal None None None _______ Normal Normal Normal Normal  L. Triceps brachii Normal None None None _______ Normal Normal Normal Normal  L. Biceps brachii Normal None None None _______ Normal Normal Normal Normal  L. Extensor digitorum communis Normal None None None  _______ Normal Normal Normal Normal  L. First dorsal interosseous Normal None None None _______ Normal Normal Normal Normal  L. Teres major Normal None None None _______ Normal Normal Normal Normal

## 2020-01-09 ENCOUNTER — Other Ambulatory Visit: Payer: Self-pay

## 2020-01-09 ENCOUNTER — Encounter (HOSPITAL_BASED_OUTPATIENT_CLINIC_OR_DEPARTMENT_OTHER): Payer: Self-pay | Admitting: Emergency Medicine

## 2020-01-09 ENCOUNTER — Emergency Department (HOSPITAL_BASED_OUTPATIENT_CLINIC_OR_DEPARTMENT_OTHER)
Admission: EM | Admit: 2020-01-09 | Discharge: 2020-01-09 | Disposition: A | Discharge status: Home / Self Care | Payer: BC Managed Care – PPO | Attending: Emergency Medicine | Admitting: Emergency Medicine

## 2020-01-09 DIAGNOSIS — M545 Low back pain, unspecified: Secondary | ICD-10-CM

## 2020-01-09 DIAGNOSIS — M549 Dorsalgia, unspecified: Secondary | ICD-10-CM | POA: Diagnosis present

## 2020-01-09 LAB — URINALYSIS, ROUTINE W REFLEX MICROSCOPIC
Bilirubin Urine: NEGATIVE
Glucose, UA: NEGATIVE mg/dL
Ketones, ur: NEGATIVE mg/dL
Leukocytes,Ua: NEGATIVE
Nitrite: NEGATIVE
Protein, ur: NEGATIVE mg/dL
Specific Gravity, Urine: 1.015 (ref 1.005–1.030)
pH: 6 (ref 5.0–8.0)

## 2020-01-09 LAB — URINALYSIS, MICROSCOPIC (REFLEX)

## 2020-01-09 LAB — PREGNANCY, URINE: Preg Test, Ur: NEGATIVE

## 2020-01-09 MED ORDER — CYCLOBENZAPRINE HCL 10 MG PO TABS
10.0000 mg | ORAL_TABLET | Freq: Three times a day (TID) | ORAL | 0 refills | Status: DC | PRN
Start: 2020-01-09 — End: 2020-07-10

## 2020-01-09 MED ORDER — DIAZEPAM 2 MG PO TABS
2.0000 mg | ORAL_TABLET | Freq: Once | ORAL | Status: AC
  Administered 2020-01-09: 2 mg via ORAL
  Filled 2020-01-09: qty 1

## 2020-01-09 MED ORDER — OXYCODONE-ACETAMINOPHEN 5-325 MG PO TABS
1.0000 | ORAL_TABLET | Freq: Once | ORAL | Status: AC
  Administered 2020-01-09: 1 via ORAL
  Filled 2020-01-09: qty 1

## 2020-01-09 MED ORDER — NAPROXEN 500 MG PO TABS
500.0000 mg | ORAL_TABLET | Freq: Two times a day (BID) | ORAL | 0 refills | Status: DC | PRN
Start: 2020-01-09 — End: 2020-07-10

## 2020-01-09 MED FILL — CYCLOBENZAPRINE HCL 10 MG T: 10 | 7 days supply | Qty: 20 | Fill #0

## 2020-01-09 MED FILL — NAPROXEN 500 MG TABS: 500 | 15 days supply | Qty: 30 | Fill #0

## 2020-01-09 NOTE — Discharge Instructions (Addendum)
Recommend scheduling follow-up with primary doctor regarding episode today.  Recommend taking the prescribed muscle relaxer and anti-inflammatories.  Note the muscle relaxer can make you drowsy extremity, driving or operating heavy machinery.  If you develop fever, numbness, weakness, abdominal pain, vomiting, or other new concerning symptom, please return to ER for reassessment.

## 2020-01-09 NOTE — ED Provider Notes (Signed)
[facility] EMERGENCY DEPARTMENT Provider Note   CSN: [id number] Arrival date & time: 01/09/20  9518     History Chief Complaint  Patient presents with  . Back Pain    Victoria [name] is a 39 y.o. female.  Presents to ER with concern for back pain.  Symptoms ongoing since yesterday, achy, crampy pain, across lower back.  Denies any known injuries, falls, recent heavy lifting.  No bladder or bowel incontinence, no saddle anesthesia, no IVDU.  Denies any chronic medical conditions, no fevers. No abd pain, no dysuria, hematuria.   HPI     Past Medical History:  Diagnosis Date  . Breast cyst 03/2014   left breast, benign  . Diverticulitis     Patient Active Problem List   Diagnosis Date Noted  . Left arm pain 08/15/2019  . Elevated serum protein level 04/05/2014  . Elevated alkaline phosphatase level 04/05/2014  . Routine general medical examination at a health care facility 04/03/2014  . OBESITY 10/30/2009  . SYNCOPE, HX OF 10/15/2009  . DIVERTICULITIS, HX OF 10/15/2009    History reviewed. No pertinent surgical history.   OB History    Gravida  1   Para      Term      Preterm      AB      Living        SAB      TAB      Ectopic      Multiple      Live Births              Family History  Problem Relation Age of Onset  . Hypertension Mother   . Coronary artery disease Mother 49       carciac stent placement  . Hypertension Father   . Breast cancer Maternal Grandmother 61  . Breast cancer Maternal Aunt        3 aunts with breast CA- diagnosed in their 8's    Social History   Tobacco Use  . Smoking status: Never Smoker  . Smokeless tobacco: Never Used  Substance Use Topics  . Alcohol use: Yes    Comment: occasional  . Drug use: No    Home Medications Prior to Admission medications   Medication Sig Start Date End Date Taking? Authorizing Provider  fluticasone (FLONASE) 50 MCG/ACT nasal spray USE 2 SPRAYS BY NASAL ROUTE  DAILY AS NEEDED. 12/25/12   [provider]  JUNEL 1.5/30 1.5-30 MG-MCG tablet Take 1 tablet by mouth daily. 03/22/14   [provider]    Allergies    Patient has no known allergies.  Review of Systems   Review of Systems  Constitutional: Negative for chills and fever.  HENT: Negative for ear pain and sore throat.   Eyes: Negative for pain and visual disturbance.  Respiratory: Negative for cough and shortness of breath.   Cardiovascular: Negative for chest pain and palpitations.  Gastrointestinal: Negative for abdominal pain and vomiting.  Genitourinary: Negative for dysuria and hematuria.  Musculoskeletal: Positive for back pain. Negative for arthralgias.  Skin: Negative for color change and rash.  Neurological: Negative for seizures and syncope.  All other systems reviewed and are negative.   Physical Exam Updated Vital Signs BP 125/74   Pulse 70   Temp 98.4 F (36.9 C) (Oral)   Resp 18   Ht 5\' 6"  (1.676 m)   Wt 99.8 kg   LMP 12/26/2019   SpO2 98%   BMI  35.51 kg/m   Physical Exam Vitals and nursing note reviewed.  Constitutional:      General: She is not in acute distress.    Appearance: She is well-developed.  HENT:     Head: Normocephalic and atraumatic.  Eyes:     Conjunctiva/sclera: Conjunctivae normal.  Cardiovascular:     Rate and Rhythm: Normal rate and regular rhythm.     Heart sounds: No murmur heard.   Pulmonary:     Effort: Pulmonary effort is normal. No respiratory distress.     Breath sounds: Normal breath sounds.  Abdominal:     Palpations: Abdomen is soft.     Tenderness: There is no abdominal tenderness.  Musculoskeletal:        General: No deformity or signs of injury.     Cervical back: Neck supple.     Comments: TTP in right and left lateral back, no deformity, no midline TTP  Skin:    General: Skin is warm and dry.     Capillary Refill: Capillary refill takes less than 2 seconds.  Neurological:     General: No focal  deficit present.     Mental Status: She is alert.  Psychiatric:        Mood and Affect: Mood normal.        Behavior: Behavior normal.     ED Results / Procedures / Treatments   Labs (all labs ordered are listed, but only abnormal results are displayed) Labs Reviewed  URINALYSIS, ROUTINE W REFLEX MICROSCOPIC - Abnormal; Notable for the following components:      Result Value   Hgb urine dipstick SMALL (*)    All other components within normal limits  URINALYSIS, MICROSCOPIC (REFLEX) - Abnormal; Notable for the following components:   Bacteria, UA FEW (*)    All other components within normal limits  PREGNANCY, URINE    EKG None  Radiology No results found.  Procedures Procedures (including critical care time)  Medications Ordered in ED Medications  diazepam (VALIUM) tablet 2 mg (2 mg Oral Given 01/09/20 0903)  oxyCODONE-acetaminophen (PERCOCET/ROXICET) 5-325 MG per tablet 1 tablet (1 tablet Oral Given 01/09/20 0867)    ED Course  I have reviewed the triage vital signs and the nursing notes.  Pertinent labs & imaging results that were available during my care of the patient were reviewed by me and considered in my medical decision making (see chart for details).    MDM Rules/Calculators/A&P                          39 year old lady presenting to ER with concern for low back pain.  She has tenderness over her lower lumbar region, no midline tenderness over her spine was appreciated given description of symptoms, suspect MSK strain in etiology.  No red flag symptoms.  Provided symptomatic control in ER, will discharge home with Rx for NSAIDs, muscle relaxer.  Recommend recheck with primary doctor, reviewed precautions and discharged home.  After the discussed management above, the patient was determined to be safe for discharge.  The patient was in agreement with this plan and all questions regarding their care were answered.  ED return precautions were discussed and the  patient will return to the ED with any significant worsening of condition.  Final Clinical Impression(s) / ED Diagnoses Final diagnoses:  Acute bilateral low back pain without sciatica    Rx / DC Orders ED Discharge Orders    None  Lucrezia Starch, MD 01/09/20 [phone]

## 2020-01-09 NOTE — ED Triage Notes (Signed)
Pt reports lower back pain x 1 day, denies fall nor injury. Denies urinary symptoms. No radiation of pain.

## 2020-07-09 ENCOUNTER — Telehealth (HOSPITAL_COMMUNITY): Payer: Self-pay | Admitting: *Deleted

## 2020-07-10 ENCOUNTER — Telehealth (HOSPITAL_COMMUNITY): Payer: Self-pay | Admitting: *Deleted

## 2020-07-10 ENCOUNTER — Encounter (HOSPITAL_COMMUNITY): Payer: Self-pay | Admitting: *Deleted

## 2020-07-10 NOTE — Telephone Encounter (Signed)
Instructed to arrive on 12/30 at 0600 for cerclage.  NPO after MN.  No medications to take .  Verbalized understanding and all questions answered.

## 2020-07-10 NOTE — Telephone Encounter (Signed)
Preadmission screen  

## 2020-07-16 ENCOUNTER — Other Ambulatory Visit (HOSPITAL_COMMUNITY)
Admission: RE | Admit: 2020-07-16 | Discharge: 2020-07-16 | Disposition: A | Discharge status: Home / Self Care | Payer: BC Managed Care – PPO | Source: Ambulatory Visit | Attending: Obstetrics and Gynecology | Admitting: Obstetrics and Gynecology

## 2020-07-16 DIAGNOSIS — Z3A13 13 weeks gestation of pregnancy: Secondary | ICD-10-CM | POA: Diagnosis not present

## 2020-07-16 DIAGNOSIS — Z01812 Encounter for preprocedural laboratory examination: Secondary | ICD-10-CM | POA: Insufficient documentation

## 2020-07-16 DIAGNOSIS — Z20822 Contact with and (suspected) exposure to covid-19: Secondary | ICD-10-CM | POA: Insufficient documentation

## 2020-07-16 DIAGNOSIS — N814 Uterovaginal prolapse, unspecified: Secondary | ICD-10-CM | POA: Diagnosis not present

## 2020-07-16 DIAGNOSIS — Z8759 Personal history of other complications of pregnancy, childbirth and the puerperium: Secondary | ICD-10-CM | POA: Diagnosis not present

## 2020-07-16 DIAGNOSIS — O34521 Maternal care for prolapse of gravid uterus, first trimester: Secondary | ICD-10-CM | POA: Diagnosis not present

## 2020-07-16 LAB — SARS CORONAVIRUS 2 (TAT 6-24 HRS): SARS Coronavirus 2: NEGATIVE

## 2020-07-18 ENCOUNTER — Other Ambulatory Visit: Payer: Self-pay

## 2020-07-18 ENCOUNTER — Encounter (HOSPITAL_COMMUNITY)
Admission: RE | Disposition: A | Discharge status: Home / Self Care | Payer: Self-pay | Source: Home / Self Care | Attending: Obstetrics and Gynecology

## 2020-07-18 ENCOUNTER — Ambulatory Visit (HOSPITAL_COMMUNITY)
Admission: RE | Admit: 2020-07-18 | Discharge: 2020-07-18 | Disposition: A | Discharge status: Home / Self Care | Payer: BC Managed Care – PPO | Attending: Obstetrics and Gynecology | Admitting: Obstetrics and Gynecology

## 2020-07-18 ENCOUNTER — Encounter (HOSPITAL_COMMUNITY): Payer: Self-pay | Admitting: Obstetrics and Gynecology

## 2020-07-18 ENCOUNTER — Ambulatory Visit (HOSPITAL_COMMUNITY): Payer: BC Managed Care – PPO | Admitting: Anesthesiology

## 2020-07-18 DIAGNOSIS — Z20822 Contact with and (suspected) exposure to covid-19: Secondary | ICD-10-CM | POA: Insufficient documentation

## 2020-07-18 DIAGNOSIS — Z98891 History of uterine scar from previous surgery: Secondary | ICD-10-CM

## 2020-07-18 DIAGNOSIS — Z8759 Personal history of other complications of pregnancy, childbirth and the puerperium: Secondary | ICD-10-CM | POA: Insufficient documentation

## 2020-07-18 DIAGNOSIS — N814 Uterovaginal prolapse, unspecified: Secondary | ICD-10-CM | POA: Insufficient documentation

## 2020-07-18 DIAGNOSIS — O34521 Maternal care for prolapse of gravid uterus, first trimester: Secondary | ICD-10-CM | POA: Diagnosis not present

## 2020-07-18 DIAGNOSIS — Z3A13 13 weeks gestation of pregnancy: Secondary | ICD-10-CM | POA: Insufficient documentation

## 2020-07-18 DIAGNOSIS — O3432 Maternal care for cervical incompetence, second trimester: Secondary | ICD-10-CM

## 2020-07-18 HISTORY — PX: CERVICAL CERCLAGE: SHX1329

## 2020-07-18 HISTORY — DX: History of uterine scar from previous surgery: Z98.891

## 2020-07-18 LAB — CBC
HCT: 33.8 % — ABNORMAL LOW (ref 36.0–46.0)
Hemoglobin: 11.2 g/dL — ABNORMAL LOW (ref 12.0–15.0)
MCH: 26.8 pg (ref 26.0–34.0)
MCHC: 33.1 g/dL (ref 30.0–36.0)
MCV: 80.9 fL (ref 80.0–100.0)
Platelets: 331 10*3/uL (ref 150–400)
RBC: 4.18 MIL/uL (ref 3.87–5.11)
RDW: 16.9 % — ABNORMAL HIGH (ref 11.5–15.5)
WBC: 9.2 10*3/uL (ref 4.0–10.5)
nRBC: 0 % (ref 0.0–0.2)

## 2020-07-18 LAB — TYPE AND SCREEN
ABO/RH(D): B POS
Antibody Screen: NEGATIVE

## 2020-07-18 SURGERY — CERCLAGE, CERVIX, VAGINAL APPROACH
Anesthesia: Spinal

## 2020-07-18 MED ORDER — LIDOCAINE-EPINEPHRINE 1 %-1:100000 IJ SOLN
INTRAMUSCULAR | Status: DC | PRN
  Administered 2020-07-18: 10 mL

## 2020-07-18 MED ORDER — LIDOCAINE HCL (PF) 1 % IJ SOLN
INTRAMUSCULAR | Status: AC
  Filled 2020-07-18: qty 30

## 2020-07-18 MED ORDER — POVIDONE-IODINE 10 % EX SWAB: 2.0000 "application " | Freq: Once | CUTANEOUS | Status: DC

## 2020-07-18 MED ORDER — MIDAZOLAM HCL 2 MG/2ML IJ SOLN
INTRAMUSCULAR | Status: AC
  Filled 2020-07-18: qty 2

## 2020-07-18 MED ORDER — ONDANSETRON HCL 4 MG PO TABS: 4.0000 mg | ORAL_TABLET | Freq: Four times a day (QID) | ORAL | Status: DC | PRN

## 2020-07-18 MED ORDER — SOD CITRATE-CITRIC ACID 500-334 MG/5ML PO SOLN
30.0000 mL | ORAL | Status: AC
  Administered 2020-07-18: 30 mL via ORAL

## 2020-07-18 MED ORDER — KETOROLAC TROMETHAMINE 30 MG/ML IJ SOLN: 30.0000 mg | Freq: Once | INTRAMUSCULAR | Status: DC | PRN

## 2020-07-18 MED ORDER — ACETAMINOPHEN 500 MG PO TABS
ORAL_TABLET | ORAL | Status: AC
  Filled 2020-07-18: qty 2

## 2020-07-18 MED ORDER — ACETAMINOPHEN 500 MG PO TABS
1000.0000 mg | ORAL_TABLET | Freq: Four times a day (QID) | ORAL | Status: DC
  Administered 2020-07-18: 1000 mg via ORAL

## 2020-07-18 MED ORDER — GABAPENTIN 100 MG PO CAPS
100.0000 mg | ORAL_CAPSULE | Freq: Two times a day (BID) | ORAL | Status: DC
  Administered 2020-07-18: 100 mg via ORAL
  Filled 2020-07-18: qty 1

## 2020-07-18 MED ORDER — KETOROLAC TROMETHAMINE 30 MG/ML IJ SOLN
30.0000 mg | Freq: Once | INTRAMUSCULAR | Status: AC
  Administered 2020-07-18: 30 mg via INTRAVENOUS

## 2020-07-18 MED ORDER — KETOROLAC TROMETHAMINE 30 MG/ML IJ SOLN
INTRAMUSCULAR | Status: AC
  Filled 2020-07-18: qty 1

## 2020-07-18 MED ORDER — TRAMADOL HCL 50 MG PO TABS: 50.0000 mg | ORAL_TABLET | Freq: Four times a day (QID) | ORAL | 0 refills | Status: AC | PRN

## 2020-07-18 MED ORDER — BUPIVACAINE IN DEXTROSE 0.75-8.25 % IT SOLN
INTRATHECAL | Status: DC | PRN
  Administered 2020-07-18: 1 mg via INTRATHECAL

## 2020-07-18 MED ORDER — ONDANSETRON HCL 4 MG/2ML IJ SOLN: 4.0000 mg | Freq: Four times a day (QID) | INTRAMUSCULAR | Status: DC | PRN

## 2020-07-18 MED ORDER — ACETAMINOPHEN 500 MG PO TABS
1000.0000 mg | ORAL_TABLET | ORAL | Status: AC
  Administered 2020-07-18: 1000 mg via ORAL

## 2020-07-18 MED ORDER — LACTATED RINGERS IV SOLN: INTRAVENOUS | Status: DC

## 2020-07-18 MED ORDER — OXYCODONE HCL 5 MG PO TABS: 5.0000 mg | ORAL_TABLET | ORAL | Status: DC | PRN

## 2020-07-18 MED ORDER — MIDAZOLAM HCL 5 MG/5ML IJ SOLN
INTRAMUSCULAR | Status: DC | PRN
  Administered 2020-07-18 (×2): 1 mg via INTRAVENOUS

## 2020-07-18 MED ORDER — ACETAMINOPHEN 500 MG PO TABS: 1000.0000 mg | ORAL_TABLET | Freq: Four times a day (QID) | ORAL | 1 refills | Status: AC | PRN

## 2020-07-18 MED ORDER — SOD CITRATE-CITRIC ACID 500-334 MG/5ML PO SOLN
ORAL | Status: AC
  Filled 2020-07-18: qty 30

## 2020-07-18 MED ORDER — FENTANYL CITRATE (PF) 100 MCG/2ML IJ SOLN: 25.0000 ug | INTRAMUSCULAR | Status: DC | PRN

## 2020-07-18 MED ORDER — LACTATED RINGERS IV SOLN: INTRAVENOUS | Status: DC | PRN

## 2020-07-18 SURGICAL SUPPLY — 17 items
CANISTER SUCT 3000ML PPV (MISCELLANEOUS) ×2 IMPLANT
CATH FOLEY 2WAY SLVR 30CC 16FR (CATHETERS) ×1 IMPLANT
GLOVE BIO SURGEON STRL SZ 6.5 (GLOVE) ×4 IMPLANT
GLOVE BIOGEL PI IND STRL 7.0 (GLOVE) ×1 IMPLANT
GLOVE BIOGEL PI INDICATOR 7.0 (GLOVE) ×1
GOWN STRL REUS W/TWL LRG LVL3 (GOWN DISPOSABLE) ×4 IMPLANT
NDL MAYO CATGUT SZ4 TPR NDL (NEEDLE) ×1 IMPLANT
NEEDLE MAYO CATGUT SZ4 (NEEDLE) ×2 IMPLANT
PACK VAGINAL MINOR WOMEN LF (CUSTOM PROCEDURE TRAY) ×2 IMPLANT
PAD OB MATERNITY 4.3X12.25 (PERSONAL CARE ITEMS) ×2 IMPLANT
PAD PREP 24X48 CUFFED NSTRL (MISCELLANEOUS) ×2 IMPLANT
SUT POLYDEK 5 CE 75 36 (SUTURE) ×4 IMPLANT
SYR BULB IRRIGATION 50ML (SYRINGE) ×2 IMPLANT
TOWEL OR 17X24 6PK STRL BLUE (TOWEL DISPOSABLE) ×4 IMPLANT
TRAY FOLEY W/BAG SLVR 14FR (SET/KITS/TRAYS/PACK) ×2 IMPLANT
TUBING NON-CON 1/4 X 20 CONN (TUBING) ×2 IMPLANT
YANKAUER SUCT BULB TIP NO VENT (SUCTIONS) ×2 IMPLANT

## 2020-07-18 NOTE — Anesthesia Postprocedure Evaluation (Signed)
Anesthesia Post Note  Patient: Victoria [name]  Procedure(s) Performed: CERCLAGE CERVICAL (N/A )     Patient location during evaluation: PACU Anesthesia Type: Spinal Level of consciousness: oriented and awake and alert Pain management: pain level controlled Vital Signs Assessment: post-procedure vital signs reviewed and stable Respiratory status: spontaneous breathing, respiratory function stable and patient connected to nasal cannula oxygen Cardiovascular status: blood pressure returned to baseline and stable Postop Assessment: no headache, no backache, no apparent nausea or vomiting and spinal receding Anesthetic complications: no   No complications documented.  Last Vitals:  Vitals:   07/18/20 1245 07/18/20 1300  BP: (!) 120/58 (!) 119/56  Pulse:    Resp: (!) 22 (!) 24  Temp:    SpO2:      Last Pain:  Vitals:   07/18/20 1300  TempSrc:   PainSc: 0-No pain   Pain Goal:                   Venancio Chenier L Jenayah Antu

## 2020-07-18 NOTE — Anesthesia Procedure Notes (Signed)
Spinal  Patient location during procedure: OR Start time: 07/18/2020 8:50 AM End time: 07/18/2020 8:54 AM Staffing Performed: anesthesiologist  Anesthesiologist: Elmer Picker, MD Preanesthetic Checklist Completed: patient identified, IV checked, risks and benefits discussed, surgical consent, monitors and equipment checked, pre-op evaluation and timeout performed Spinal Block Patient position: sitting Prep: DuraPrep and site prepped and draped Patient monitoring: cardiac monitor, continuous pulse ox and blood pressure Approach: midline Location: L3-4 Injection technique: single-shot Needle Needle type: Pencan  Needle gauge: 24 G Needle length: 9 cm Assessment Sensory level: T6 Additional Notes Functioning IV was confirmed and monitors were applied. Sterile prep and drape, including hand hygiene and sterile gloves were used. The patient was positioned and the spine was prepped. The skin was anesthetized with lidocaine.  Free flow of clear CSF was obtained prior to injecting local anesthetic into the CSF.  The spinal needle aspirated freely following injection.  The needle was carefully withdrawn.  The patient tolerated the procedure well.

## 2020-07-18 NOTE — H&P (Signed)
Victoria [name] is an 39 [id number] female who presents at 13weeks for cervical cerclage. Pt has a history of cervical incompetence.  She delivered at 16 and then [redacted] weeks gestation in 2003 and 2012; she failed a rescue cerclage with the latter/most recent pregnancy. Pt is scheduled for a prophylactic cerclage   Pertinent Gynecological History: Menses: pt is pregnant Bleeding: none Contraception: n/a DES exposure: denies Blood transfusions: none Sexually transmitted diseases: no past history Previous GYN Procedures: n/a  OB History: G7, P1231   Menstrual History: Menarche age: 43 Patient's last menstrual period was 12/26/2019.    Past Medical History:  Diagnosis Date  . Breast cyst 03/2014   left breast, benign  . Diverticulitis   . Medical history non-contributory     Past Surgical History:  Procedure Laterality Date  . CERVICAL CERCLAGE    . WISDOM TOOTH EXTRACTION      Family History  Problem Relation Age of Onset  . Hyperlipidemia Mother   . Hypertension Father   . Breast cancer Maternal Grandmother 86  . Coronary artery disease Maternal Grandmother   . Hypertension Maternal Grandmother   . Breast cancer Maternal Aunt        3 aunts with breast CA- diagnosed in their 12's    Social History:  reports that she has never smoked. She has never used smokeless tobacco. She reports current alcohol use. She reports that she does not use drugs.  Allergies: No Known Allergies  No medications prior to admission.    Review of Systems  Constitutional: Negative for activity change, appetite change, diaphoresis and fatigue.  Eyes: Negative for photophobia and visual disturbance.  Respiratory: Negative for chest tightness and shortness of breath.   Cardiovascular: Negative for chest pain, palpitations and leg swelling.  Gastrointestinal: Negative for abdominal pain.  Genitourinary: Negative for pelvic pain.  Musculoskeletal: Negative for myalgias.  Neurological: Negative  for seizures, light-headedness, numbness and headaches.  Psychiatric/Behavioral: The patient is nervous/anxious.     Last menstrual period 12/26/2019. Physical Exam Vitals and nursing note reviewed. Exam conducted with a chaperone present.  Constitutional:      Appearance: Normal appearance.  Pulmonary:     Effort: Pulmonary effort is normal.  Abdominal:     Palpations: Abdomen is soft.  Musculoskeletal:        General: Normal range of motion.     Cervical back: Normal range of motion.  Skin:    General: Skin is warm.     Capillary Refill: Capillary refill takes 2 to 3 seconds.  Neurological:     General: No focal deficit present.     Mental Status: She is alert and oriented to person, place, and time. Mental status is at baseline.  Psychiatric:        Mood and Affect: Mood normal.        Behavior: Behavior normal.        Thought Content: Thought content normal.        Judgment: Judgment normal.     No results found for this or any previous visit (from the past 24 hour(s)).  No results found.  Assessment/Plan: [id number] at [redacted]weeks gestation here for prophylactic cerclage  - Admit  - ERAS protocol - FHTS prior to and after procedure - Confirm consent  - To OR when ready  Jordon Bourquin W Emilio Baylock 07/18/2020, 12:11 AM

## 2020-07-18 NOTE — Op Note (Signed)
Operative Note    Preoperative Diagnosis:  1. IUP at [redacted] weeks gestation 2. History of incompetent cervix         Postoperative Diagnosis:  Same 3. Uterine prolapse              Procedure: Cervical cerclage    Surgeon: Britt Bottom DO  Anesthesia: Spinal   Fluids: LR 1L EBL: <68ml UOP:   Findings: Cervix long and closed; uterine prolapse; external os close to introitus    Specimen: none   Procedure Note Pt taken to OR and spinal anesthesia administered without complications. Pt placed in dorsal lithotomy position and appropriate time out done. Pt was prepped with betadine, her bladder was emptied and pt draped in sterile fashion. A weighted  speculum was then placed posteriorly with a lateral sims anteriorly and cervix easily visualized. Local anesthesia of 1% lidocaine was injected at 3 and 9  o'clock. Two sutures were placed using 0 mersilene suture circumferentially The first suture had its knot placed at 12 o'clock; the second, more external suture had its knot at 6 o'clock. Essentially no bleeding was noted at this time. Thus, all instruments were then removed. Counts were noted to be correct. Pt was taken to recovery room in stable condition. She tolerated the procedure well.

## 2020-07-18 NOTE — Discharge Instructions (Signed)
Call office with any concerns [phone] 

## 2020-07-18 NOTE — Anesthesia Preprocedure Evaluation (Signed)
Anesthesia Evaluation  Patient identified by MRN, date of birth, ID band Patient awake    Reviewed: Allergy & Precautions, NPO status , Patient's Chart, lab work & pertinent test results  Airway Mallampati: II  TM Distance: >3 FB Neck ROM: Full    Dental no notable dental hx.    Pulmonary neg pulmonary ROS,    Pulmonary exam normal breath sounds clear to auscultation       Cardiovascular negative cardio ROS Normal cardiovascular exam Rhythm:Regular Rate:Normal     Neuro/Psych negative neurological ROS  negative psych ROS   GI/Hepatic negative GI ROS, Neg liver ROS,   Endo/Other  negative endocrine ROS  Renal/GU negative Renal ROS  negative genitourinary   Musculoskeletal negative musculoskeletal ROS (+)   Abdominal   Peds  Hematology negative hematology ROS (+)   Anesthesia Other Findings [phone] female who presents at 13weeks for cervical cerclage  Reproductive/Obstetrics (+) Pregnancy                             Anesthesia Physical Anesthesia Plan  ASA: II  Anesthesia Plan: Spinal   Post-op Pain Management:    Induction:   PONV Risk Score and Plan: Treatment may vary due to age or medical condition  Airway Management Planned: Natural Airway  Additional Equipment:   Intra-op Plan:   Post-operative Plan:   Informed Consent: I have reviewed the patients History and Physical, chart, labs and discussed the procedure including the risks, benefits and alternatives for the proposed anesthesia with the patient or authorized representative who has indicated his/her understanding and acceptance.     Dental advisory given  Plan Discussed with: CRNA  Anesthesia Plan Comments:         Anesthesia Quick Evaluation

## 2020-07-18 NOTE — Transfer of Care (Signed)
Immediate Anesthesia Transfer of Care Note  Patient: Victoria [name]  Procedure(s) Performed: CERCLAGE CERVICAL (N/A )  Patient Location: PACU  Anesthesia Type:Spinal  Level of Consciousness: awake  Airway & Oxygen Therapy: Patient Spontanous Breathing  Post-op Assessment: Report given to RN and Post -op Vital signs reviewed and stable  Post vital signs: Reviewed and stable  Last Vitals:  Vitals Value Taken Time  BP    Temp    Pulse    Resp    SpO2      Last Pain:  Vitals:   07/18/20 0643  TempSrc: Oral         Complications: No complications documented.

## 2020-07-19 ENCOUNTER — Encounter (HOSPITAL_COMMUNITY): Payer: Self-pay | Admitting: Obstetrics and Gynecology

## 2020-07-19 ENCOUNTER — Inpatient Hospital Stay (HOSPITAL_COMMUNITY)
Admission: AD | Admit: 2020-07-19 | Discharge: 2020-07-20 | Disposition: A | Discharge status: Home / Self Care | Payer: BC Managed Care – PPO | Attending: Obstetrics and Gynecology | Admitting: Obstetrics and Gynecology

## 2020-07-19 ENCOUNTER — Other Ambulatory Visit: Payer: Self-pay

## 2020-07-19 DIAGNOSIS — U071 COVID-19: Secondary | ICD-10-CM | POA: Insufficient documentation

## 2020-07-19 DIAGNOSIS — R519 Headache, unspecified: Secondary | ICD-10-CM | POA: Diagnosis not present

## 2020-07-19 DIAGNOSIS — O98511 Other viral diseases complicating pregnancy, first trimester: Secondary | ICD-10-CM | POA: Insufficient documentation

## 2020-07-19 DIAGNOSIS — O26892 Other specified pregnancy related conditions, second trimester: Secondary | ICD-10-CM

## 2020-07-19 DIAGNOSIS — O3431 Maternal care for cervical incompetence, first trimester: Secondary | ICD-10-CM | POA: Diagnosis not present

## 2020-07-19 DIAGNOSIS — Z3A13 13 weeks gestation of pregnancy: Secondary | ICD-10-CM | POA: Diagnosis not present

## 2020-07-19 DIAGNOSIS — O3432 Maternal care for cervical incompetence, second trimester: Secondary | ICD-10-CM

## 2020-07-19 DIAGNOSIS — R5081 Fever presenting with conditions classified elsewhere: Secondary | ICD-10-CM

## 2020-07-19 DIAGNOSIS — R509 Fever, unspecified: Secondary | ICD-10-CM | POA: Diagnosis present

## 2020-07-19 DIAGNOSIS — O98512 Other viral diseases complicating pregnancy, second trimester: Secondary | ICD-10-CM

## 2020-07-19 LAB — URINALYSIS, ROUTINE W REFLEX MICROSCOPIC
Bacteria, UA: NONE SEEN
Bilirubin Urine: NEGATIVE
Glucose, UA: NEGATIVE mg/dL
Hgb urine dipstick: NEGATIVE
Ketones, ur: NEGATIVE mg/dL
Leukocytes,Ua: NEGATIVE
Nitrite: NEGATIVE
Protein, ur: NEGATIVE mg/dL
Specific Gravity, Urine: 1.012 (ref 1.005–1.030)
pH: 5 (ref 5.0–8.0)

## 2020-07-19 LAB — CBC WITH DIFFERENTIAL/PLATELET
Abs Immature Granulocytes: 0.04 10*3/uL (ref 0.00–0.07)
Basophils Absolute: 0 10*3/uL (ref 0.0–0.1)
Basophils Relative: 0 %
Eosinophils Absolute: 0 10*3/uL (ref 0.0–0.5)
Eosinophils Relative: 1 %
HCT: 30.7 % — ABNORMAL LOW (ref 36.0–46.0)
Hemoglobin: 10.4 g/dL — ABNORMAL LOW (ref 12.0–15.0)
Immature Granulocytes: 1 %
Lymphocytes Relative: 6 %
Lymphs Abs: 0.4 10*3/uL — ABNORMAL LOW (ref 0.7–4.0)
MCH: 27.1 pg (ref 26.0–34.0)
MCHC: 33.9 g/dL (ref 30.0–36.0)
MCV: 79.9 fL — ABNORMAL LOW (ref 80.0–100.0)
Monocytes Absolute: 1.1 10*3/uL — ABNORMAL HIGH (ref 0.1–1.0)
Monocytes Relative: 15 %
Neutro Abs: 5.8 10*3/uL (ref 1.7–7.7)
Neutrophils Relative %: 77 %
Platelets: 276 10*3/uL (ref 150–400)
RBC: 3.84 MIL/uL — ABNORMAL LOW (ref 3.87–5.11)
RDW: 17.1 % — ABNORMAL HIGH (ref 11.5–15.5)
WBC: 7.4 10*3/uL (ref 4.0–10.5)
nRBC: 0 % (ref 0.0–0.2)

## 2020-07-19 LAB — RESP PANEL BY RT-PCR (FLU A&B, COVID) ARPGX2
Influenza A by PCR: NEGATIVE
Influenza B by PCR: NEGATIVE
SARS Coronavirus 2 by RT PCR: POSITIVE — AB

## 2020-07-19 MED ORDER — DIPHENHYDRAMINE HCL 50 MG/ML IJ SOLN
25.0000 mg | Freq: Once | INTRAMUSCULAR | Status: AC
  Administered 2020-07-19: 25 mg via INTRAVENOUS
  Filled 2020-07-19: qty 1

## 2020-07-19 MED ORDER — DEXAMETHASONE SODIUM PHOSPHATE 10 MG/ML IJ SOLN
10.0000 mg | Freq: Once | INTRAMUSCULAR | Status: AC
  Administered 2020-07-19: 10 mg via INTRAVENOUS
  Filled 2020-07-19: qty 1

## 2020-07-19 MED ORDER — LACTATED RINGERS IV BOLUS
1000.0000 mL | Freq: Once | INTRAVENOUS | Status: AC
  Administered 2020-07-19: 1000 mL via INTRAVENOUS

## 2020-07-19 MED ORDER — PROCHLORPERAZINE EDISYLATE 10 MG/2ML IJ SOLN
10.0000 mg | Freq: Once | INTRAMUSCULAR | Status: AC
  Administered 2020-07-19: 10 mg via INTRAVENOUS
  Filled 2020-07-19: qty 2

## 2020-07-19 NOTE — MAU Provider Note (Signed)
Chief Complaint: Fever and Headache   Event Date/Time   First Provider Initiated Contact with Patient 07/19/20 2202      SUBJECTIVE HPI: Victoria [name] is a 39 y.o. [id number] at [redacted]w[redacted]d who presents to [facility] reporting fever of 101.8 and headache today.  Had a prophylactic cerclage performed 07/18/2020 with history of cervical incompetence and previable delivery x2.  Ibuprofen at 3 PM and extra strength Tylenol at 8:15 PM.   Location: Frontal headache Quality: Throbbing Severity: 7/10 on pain scale Duration: Less than 24 hours Context: Cerclage placed yesterday, 07/18/2020 under spinal anesthesia.  No headache the day of the procedure.  No mention of complications with anesthesia notes. Timing: Constant Modifying factors: No improvement with ibuprofen or extra strength Tylenol Associated signs and symptoms: Positive for fever 101.8, chills.  Negative for congestion neck pain, vision changes, abdominal pain, leaking of fluid, vaginal discharge urinary complaints, GI complaints.  Still having scant spotting since cerclage yesterday which has decreased.  No known sick contacts.  Had negative Covid screening test 3 days ago.  Past Medical History:  Diagnosis Date  . Breast cyst 03/2014   left breast, benign  . Diverticulitis   . Medical history non-contributory   Cervical insufficiency  OB History  Gravida Para Term Preterm AB Living  7 3 1 2 3     SAB IAB Ectopic Multiple Live Births  1 2          # Outcome Date GA Lbr Len/2nd Weight Sex Delivery Anes PTL Lv  7 Current           6 IAB           5 IAB           4 SAB           3 Term      Vag-Spont     2 Preterm      Vag-Spont     1 Preterm      Vag-Spont      Past Surgical History:  Procedure Laterality Date  . CERVICAL CERCLAGE    . CERVICAL CERCLAGE N/A 07/18/2020   Procedure: CERCLAGE CERVICAL;  Surgeon: 07/20/2020, DO;  Location: [facility];  Service: Gynecology;  Laterality: N/A;  . WISDOM TOOTH  EXTRACTION     Social History   Socioeconomic History  . Marital status: Divorced    Spouse name: Not on file  . Number of children: Not on file  . Years of education: Not on file  . Highest education level: Not on file  Occupational History  . Not on file  Tobacco Use  . Smoking status: Never Smoker  . Smokeless tobacco: Never Used  Vaping Use  . Vaping Use: Never used  Substance and Sexual Activity  . Alcohol use: Yes    Comment: occasional  . Drug use: No  . Sexual activity: Not on file  Other Topics Concern  . 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 Hyperlipidemia Mother   . Hypertension Father   . Breast cancer Maternal Grandmother 27  . Coronary artery disease Maternal Grandmother   . Hypertension Maternal Grandmother   . Breast cancer Maternal Aunt        3 aunts with breast CA- diagnosed in their 5's   No current facility-administered medications on file prior to encounter.   Current Outpatient Medications on File Prior to Encounter  Medication Sig Dispense Refill  . acetaminophen (TYLENOL) 500 MG tablet Take 2 tablets (1,000 mg total) by mouth every 6 (six) hours as needed for moderate pain. 30 tablet 1  . Prenatal Vit-Fe Fumarate-FA (MULTIVITAMIN-PRENATAL) 27-0.8 MG TABS tablet Take 1 tablet by mouth every evening.    . traMADol (ULTRAM) 50 MG tablet Take 1 tablet (50 mg total) by mouth every 6 (six) hours as needed for up to 5 days. 20  tablet 0   No Known Allergies  I have reviewed patient's Past Medical Hx, Surgical Hx, Family Hx, Social Hx, medications and allergies.   Review of Systems  Constitutional: Positive for chills and fever. Negative for appetite change.  HENT: Negative for congestion.   Eyes: Negative for visual disturbance.  Respiratory: Negative for cough and shortness of breath.   Gastrointestinal: Negative for abdominal pain, diarrhea, nausea and vomiting.  Genitourinary: Positive for frequency and vaginal bleeding (Scant spotting). Negative for dysuria, flank pain, urgency and vaginal discharge.  Musculoskeletal: Positive for myalgias.  Neurological: Positive for headaches. Negative for speech difficulty and weakness.    OBJECTIVE Patient Vitals for the past 24 hrs:  BP Temp Pulse Resp SpO2 Height Weight  07/20/20 0016 133/62 98.7 F (37.1 C) (!) 107 (!) 25 -- -- --  07/19/20 2204 -- -- (!) 113 -- -- -- --  07/19/20 2147 132/71 99.1 F (37.3 C) (!) 121 (!) 26 100 % 5\' 6"  (1.676 m) 101.2 kg   Constitutional: Well-developed, well-nourished female in no acute distress.  Cardiovascular: Mild tachycardia Respiratory: normal rate and effort.  GI: Abd soft, non-tender, gravid appropriate for gestational age. Neurologic: Alert and oriented x 4.  GU: Neg CVAT.  SPECULUM EXAM: Deferred  LAB RESULTS Results for orders placed or performed during the hospital encounter of 07/19/20 (from the past 24 hour(s))  Resp Panel by RT-PCR (Flu A&B, Covid) Nasopharyngeal Swab     Status: Abnormal   Collection Time: 07/19/20  9:59 PM   Specimen: Nasopharyngeal Swab; Nasopharyngeal(NP) swabs in vial transport medium  Result Value Ref Range   SARS Coronavirus 2 by RT PCR POSITIVE (A) NEGATIVE   Influenza A by PCR NEGATIVE NEGATIVE   Influenza B by PCR NEGATIVE NEGATIVE  Urinalysis, Routine w reflex microscopic Urine, Clean Catch     Status: None   Collection Time: 07/19/20 10:00 PM  Result Value Ref Range    Color, Urine YELLOW YELLOW   APPearance CLEAR CLEAR   Specific Gravity, Urine 1.012 1.005 - 1.030   pH 5.0 5.0 - 8.0   Glucose, UA NEGATIVE NEGATIVE mg/dL   Hgb urine dipstick NEGATIVE NEGATIVE   Bilirubin Urine NEGATIVE NEGATIVE   Ketones, ur NEGATIVE NEGATIVE mg/dL   Protein, ur NEGATIVE NEGATIVE mg/dL   Nitrite NEGATIVE NEGATIVE   Leukocytes,Ua NEGATIVE NEGATIVE   RBC / HPF 0-5 0 - 5 RBC/hpf   WBC, UA 0-5 0 - 5 WBC/hpf   Bacteria, UA NONE  SEEN NONE SEEN   Squamous Epithelial / LPF 0-5 0 - 5   Mucus PRESENT   CBC with Differential/Platelet     Status: Abnormal   Collection Time: 07/19/20 10:21 PM  Result Value Ref Range   WBC 7.4 4.0 - 10.5 K/uL   RBC 3.84 (L) 3.87 - 5.11 MIL/uL   Hemoglobin 10.4 (L) 12.0 - 15.0 g/dL   HCT 77.4 (L) 14.2 - 39.5 %   MCV 79.9 (L) 80.0 - 100.0 fL   MCH 27.1 26.0 - 34.0 pg   MCHC 33.9 30.0 - 36.0 g/dL   RDW 32.0 (H) 23.3 - 43.5 %   Platelets 276 150 - 400 K/uL   nRBC 0.0 0.0 - 0.2 %   Neutrophils Relative % 77 %   Neutro Abs 5.8 1.7 - 7.7 K/uL   Lymphocytes Relative 6 %   Lymphs Abs 0.4 (L) 0.7 - 4.0 K/uL   Monocytes Relative 15 %   Monocytes Absolute 1.1 (H) 0.1 - 1.0 K/uL   Eosinophils Relative 1 %   Eosinophils Absolute 0.0 0.0 - 0.5 K/uL   Basophils Relative 0 %   Basophils Absolute 0.0 0.0 - 0.1 K/uL   Immature Granulocytes 1 %   Abs Immature Granulocytes 0.04 0.00 - 0.07 K/uL    IMAGING No results found.  MAU COURSE Orders Placed This Encounter  Procedures  . Resp Panel by RT-PCR (Flu A&B, Covid) Nasopharyngeal Swab  . CBC with Differential/Platelet  . Urinalysis, Routine w reflex microscopic Urine, Clean Catch  . Airborne and Contact precautions  . Discharge patient   Meds ordered this encounter  Medications  . lactated ringers bolus 1,000 mL  . prochlorperazine (COMPAZINE) injection 10 mg  . diphenhydrAMINE (BENADRYL) injection 25 mg  . dexamethasone (DECADRON) injection 10 mg    MDM -1 day status post cerclage  placement with a fever and headache most likely due to Covid.  Headache decreased significantly to 4/10.  Patient reports feeling much better.  No evidence of endometritis or exercise complications.  Discussed with Dr. Earlene Plater and Dr. Senaida Ores agree that patient is appropriate for outpatient antibiotics not indicated.   ASSESSMENT 1. COVID-19 affecting pregnancy in second trimester   2. Headache in pregnancy, antepartum, second trimester   3. Fever due to COVID-19   4. Cervical cerclage suture present in second trimester     PLAN Discharge home in stable condition. Covid precautions Discussed the options for outpatient treatment for Covid.  Offered to have monoclonal antibody clinic contact pt.  Patient declined.  Follow-up Information    [facility] Follow up.   Why: Call office to see when your next appointment should be scheduled.  Contact information: [address]  [address] [place] [address] [phone]        [facility] Follow up.   Specialty: Obstetrics and Gynecology Why: As needed if Covid symptoms worsen (high fevers uncontrolled with Tylenol, shortness of breath, difficulty breathing, severe nausea and vomiting) of the complications (increased vaginal bleeding, leaking of fluid, abdominal pain relief with Tylenol) Contact information: [address] [id number] [facility] [address] [phone]             Allergies as of 07/20/2020   No Known Allergies     Medication List    TAKE these medications   acetaminophen 500 MG tablet Commonly known as: TYLENOL Take 2 tablets (1,000 mg total) by mouth every 6 (six) hours as needed for moderate pain.  multivitamin-prenatal 27-0.8 MG Tabs tablet Take 1 tablet by mouth every evening.   traMADol 50 MG tablet Commonly known as: Ultram Take 1 tablet (50 mg total) by mouth every 6 (six) hours as needed for up to 5 days.        Katrinka BlazingSmith, [place],  [place] 07/20/2020  12:37 AM

## 2020-07-19 NOTE — MAU Note (Addendum)
Had cerclage placed yesterday. Awoke today with h/a. Took Tylenol at 1030 and did not help. Called office this afternoon and told to take Ibuprofen 600 which pt did. Headache continues and had fever of 101.8 tonight. Took Tylenol 500mg  x 2po at 2015. Having small amt brown spotting. Some body aches, esp in legs

## 2020-07-20 NOTE — Discharge Instructions (Signed)
10 Things You Can Do to Manage Your COVID-19 Symptoms at Home If you have possible or confirmed COVID-19: 1. Stay home from work and school. And stay away from other public places. If you must go out, avoid using any kind of public transportation, ridesharing, or taxis. 2. Monitor your symptoms carefully. If your symptoms get worse, call your healthcare provider immediately. 3. Get rest and stay hydrated. 4. If you have a medical appointment, call the healthcare provider ahead of time and tell them that you have or may have COVID-19. 5. For medical emergencies, call 911 and notify the dispatch personnel that you have or may have COVID-19. 6. Cover your cough and sneezes with a tissue or use the inside of your elbow. 7. Wash your hands often with soap and water for at least 20 seconds or clean your hands with an alcohol-based hand sanitizer that contains at least 60% alcohol. 8. As much as possible, stay in a specific room and away from other people in your home. Also, you should use a separate bathroom, if available. If you need to be around other people in or outside of the home, wear a mask. 9. Avoid sharing personal items with other people in your household, like dishes, towels, and bedding. 10. Clean all surfaces that are touched often, like counters, tabletops, and doorknobs. Use household cleaning sprays or wipes according to the label instructions. [email or web address] 01/18/2019 This information is not intended to replace advice given to you by your health care provider. Make sure you discuss any questions you have with your health care provider. Document Revised: 06/22/2019 Document Reviewed: 06/22/2019 Elsevier Patient Education  2020 Elsevier Inc.   COVID-19: Quarantine vs. Isolation QUARANTINE keeps someone who was in close contact with someone who has COVID-19 away from others. If you had close contact with a person who has COVID-19  Stay home until 14 days after your last  contact.  Check your temperature twice a day and watch for symptoms of COVID-19.  If possible, stay away from people who are at higher-risk for getting very sick from COVID-19. ISOLATION keeps someone who is sick or tested positive for COVID-19 without symptoms away from others, even in their own home. If you are sick and think or know you have COVID-19  Stay home until after ? At least 10 days since symptoms first appeared and ? At least 24 hours with no fever without fever-reducing medication and ? Symptoms have improved If you tested positive for COVID-19 but do not have symptoms  Stay home until after ? 10 days have passed since your positive test If you live with others, stay in a specific "sick room" or area and away from other people or animals, including pets. Use a separate bathroom, if available. [email or web address] 02/06/2019 This information is not intended to replace advice given to you by your health care provider. Make sure you discuss any questions you have with your health care provider. Document Revised: 06/22/2019 Document Reviewed: 06/22/2019 Elsevier Patient Education  2020 [employer].

## 2020-07-20 NOTE — Progress Notes (Signed)
Written and verbal d/c instructions given and understanding voiced. 

## 2020-07-20 NOTE — L&D Delivery Note (Signed)
DELIVERY NOTE  Pt complete and at +2 station with urge to push. Epidural controlling pain. Pt pushed and delivered a viable female infant in ROA position. Anterior and posterior shoulders spontaneously delivered with next two pushes; body easily followed next. Infant placed on mothers abdomen and bulb suction of mouth and nose performed. Cord was then clamped and cut by FOB. Cord blood obtained, 3VC. Baby had a vigorous spontaneous cry noted. Placenta then delivered at 1323 intact. Fundal massage performed and pitocin per protocol. Fundus firm. The following lacerations were noted: 1st degree. Repaired in routine fashion with 2-0 vicryl. Mother and baby stable. Counts correct   Infant time: 1320 Gender: female Placenta time: 1323 Apgars: 9/9 Weight: pending skin-to-skin  Desires BTL, has epidural in place. Dicussed with patient R/B/A of postpartum bilateral tubal ligation. Alternatives includes LARCs. Discussed increased risk of ectopic pregnancy after procedure (1 in 3 pregnancies post-ligation can be ectopic) and that a pregnancy test should be taken if suspicion arises postpartum. Discussed risk of failure 1/100, backup method should be used for 2-4wks. Discussed R/B or surgical procedure including bleeding, infection, injury to surrounding organs. Patient agrees to blood products if needed. Discussed that BTL does not protect against STI and that barrier protection should be used. Patient understands and intends to comply.

## 2020-09-16 ENCOUNTER — Encounter (HOSPITAL_COMMUNITY): Payer: Self-pay | Admitting: Obstetrics and Gynecology

## 2020-09-16 ENCOUNTER — Inpatient Hospital Stay (HOSPITAL_COMMUNITY)
Admission: AD | Admit: 2020-09-16 | Discharge: 2020-09-16 | Disposition: A | Discharge status: Home / Self Care | Payer: BC Managed Care – PPO | Attending: Obstetrics and Gynecology | Admitting: Obstetrics and Gynecology

## 2020-09-16 ENCOUNTER — Inpatient Hospital Stay (HOSPITAL_BASED_OUTPATIENT_CLINIC_OR_DEPARTMENT_OTHER): Payer: BC Managed Care – PPO

## 2020-09-16 ENCOUNTER — Other Ambulatory Visit: Payer: Self-pay

## 2020-09-16 DIAGNOSIS — O09292 Supervision of pregnancy with other poor reproductive or obstetric history, second trimester: Secondary | ICD-10-CM

## 2020-09-16 DIAGNOSIS — Z3A21 21 weeks gestation of pregnancy: Secondary | ICD-10-CM

## 2020-09-16 DIAGNOSIS — R102 Pelvic and perineal pain: Secondary | ICD-10-CM | POA: Diagnosis present

## 2020-09-16 DIAGNOSIS — O09219 Supervision of pregnancy with history of pre-term labor, unspecified trimester: Secondary | ICD-10-CM | POA: Diagnosis not present

## 2020-09-16 DIAGNOSIS — O09522 Supervision of elderly multigravida, second trimester: Secondary | ICD-10-CM | POA: Insufficient documentation

## 2020-09-16 DIAGNOSIS — O26892 Other specified pregnancy related conditions, second trimester: Secondary | ICD-10-CM

## 2020-09-16 DIAGNOSIS — O26899 Other specified pregnancy related conditions, unspecified trimester: Secondary | ICD-10-CM

## 2020-09-16 DIAGNOSIS — O99891 Other specified diseases and conditions complicating pregnancy: Secondary | ICD-10-CM

## 2020-09-16 DIAGNOSIS — O3432 Maternal care for cervical incompetence, second trimester: Secondary | ICD-10-CM

## 2020-09-16 DIAGNOSIS — O09212 Supervision of pregnancy with history of pre-term labor, second trimester: Secondary | ICD-10-CM

## 2020-09-16 LAB — URINALYSIS, ROUTINE W REFLEX MICROSCOPIC
Bilirubin Urine: NEGATIVE
Glucose, UA: NEGATIVE mg/dL
Hgb urine dipstick: NEGATIVE
Ketones, ur: NEGATIVE mg/dL
Leukocytes,Ua: NEGATIVE
Nitrite: NEGATIVE
Protein, ur: NEGATIVE mg/dL
Specific Gravity, Urine: 1.005 (ref 1.005–1.030)
pH: 6 (ref 5.0–8.0)

## 2020-09-16 LAB — WET PREP, GENITAL
Clue Cells Wet Prep HPF POC: NONE SEEN
Sperm: NONE SEEN
Trich, Wet Prep: NONE SEEN
Yeast Wet Prep HPF POC: NONE SEEN

## 2020-09-16 LAB — POCT FERN TEST: POCT Fern Test: NEGATIVE

## 2020-09-16 IMAGING — US US MFM OB LIMITED
1 series · 14 of 28 positions shown · non-contrast
Comparison: none

[Series 1: us mfm ob limited · 14 of 35 slices shown]
[im 2/35]
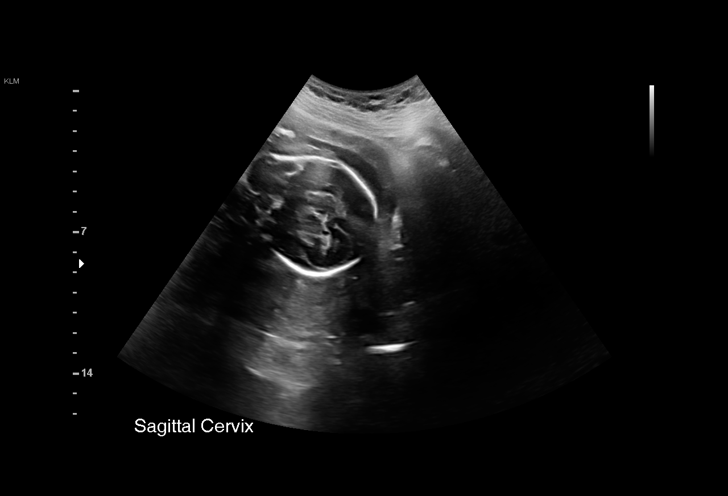
[im 4/35]
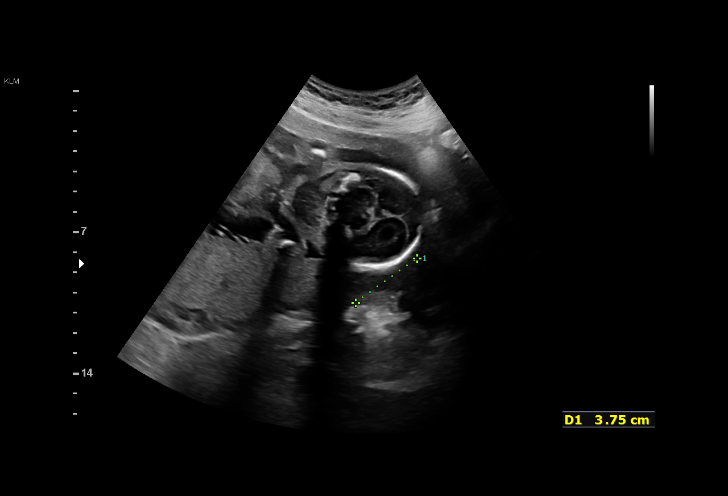
[im 7/35]
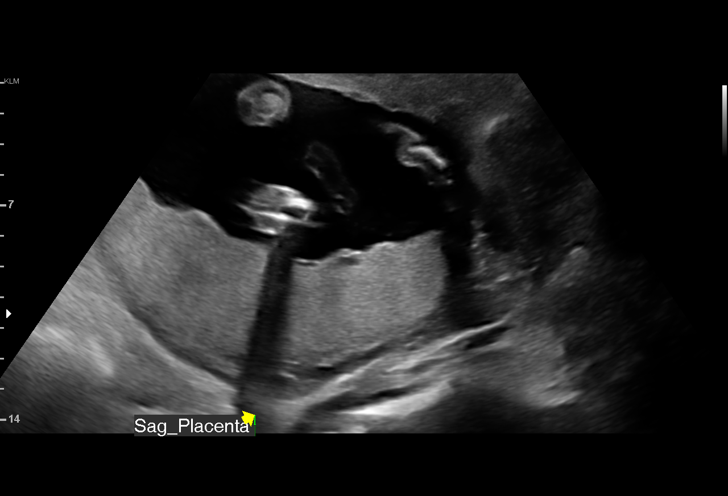
[im 9/35]
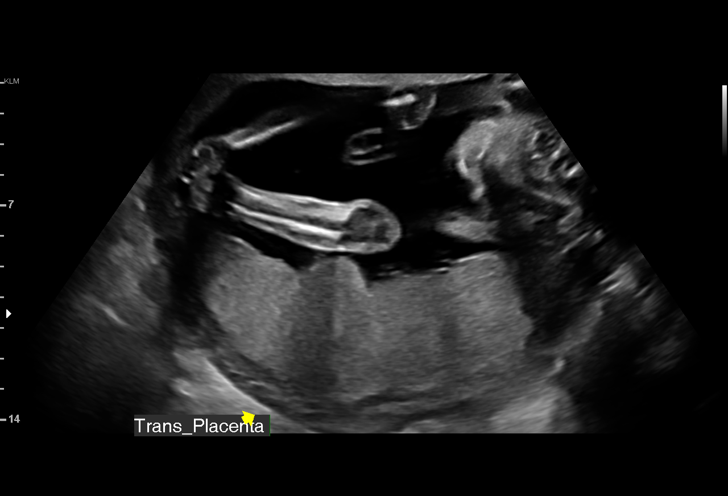
[im 12/35]
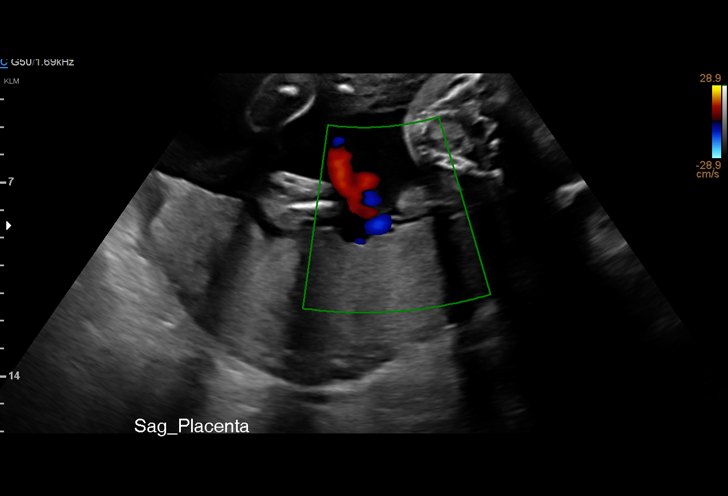
[im 14/35]
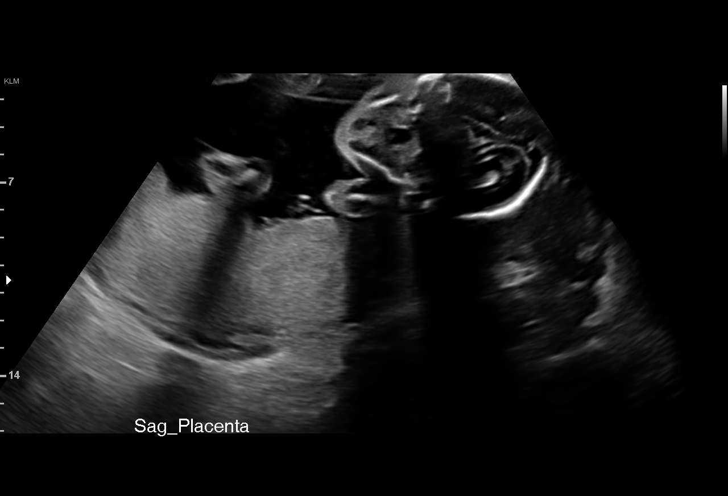
[im 17/35]
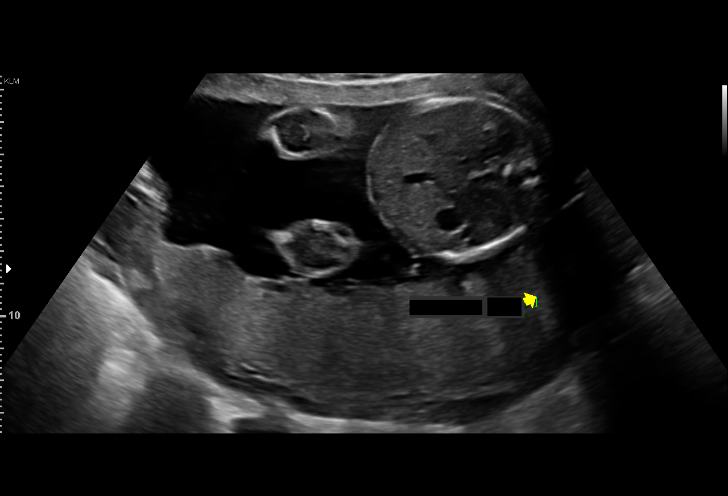
[im 19/35]
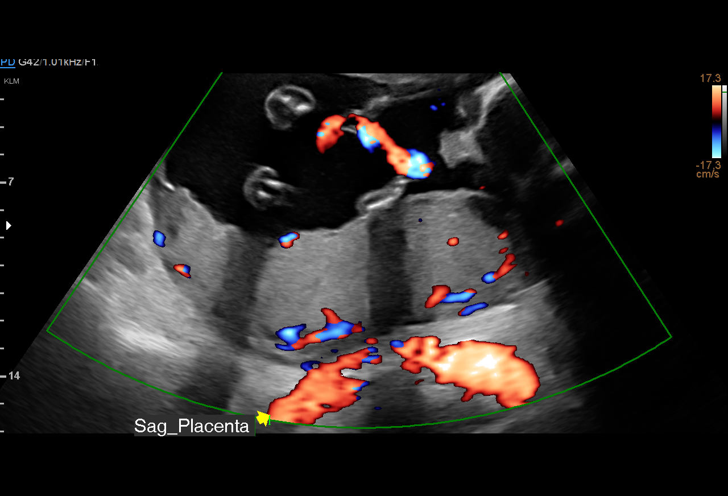
[im 22/35]
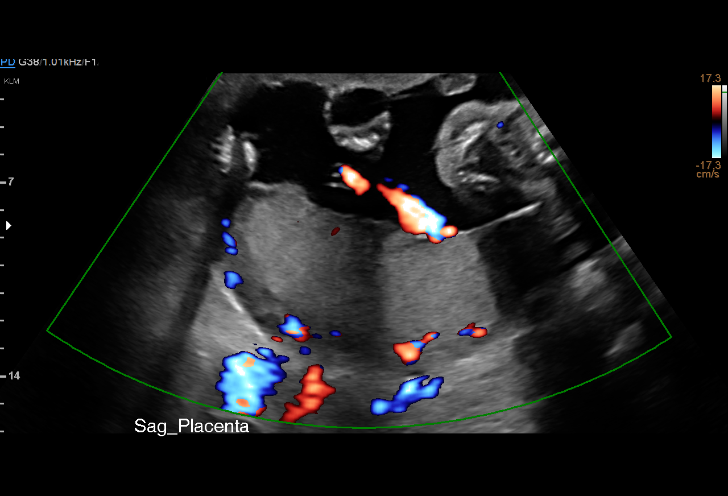
[im 24/35]
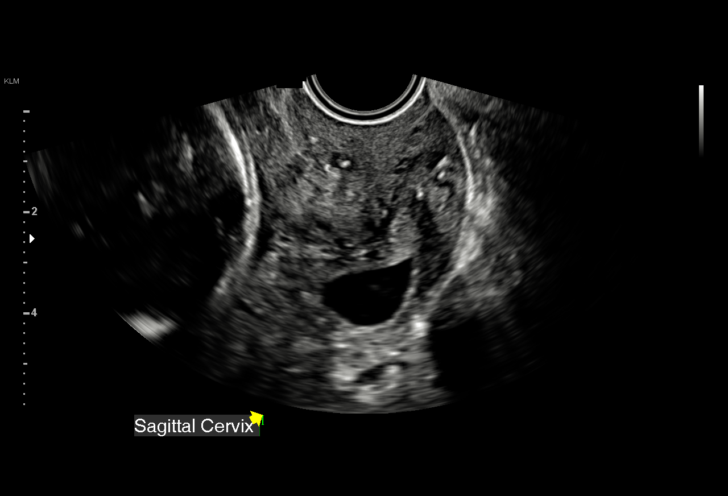
[im 27/35]
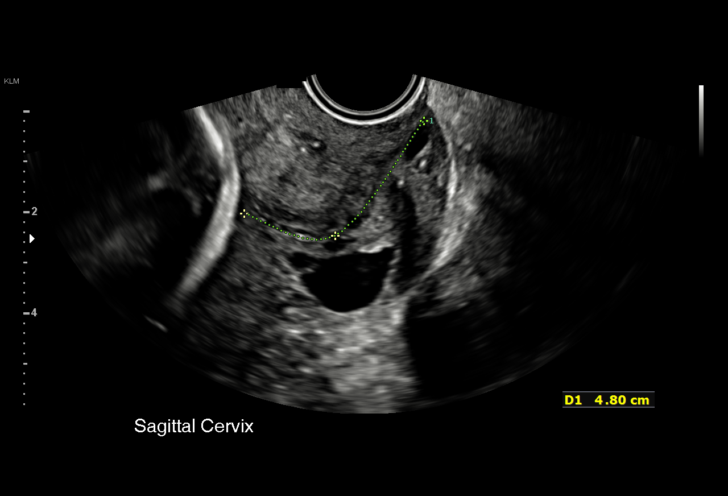
[im 29/35]
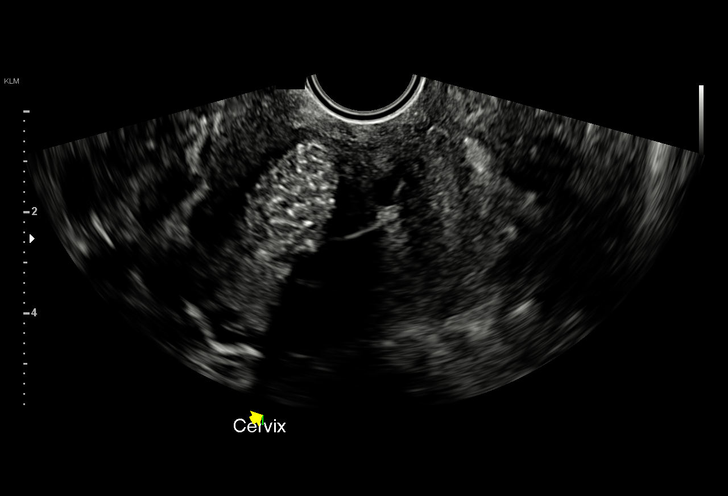
[im 32/35]
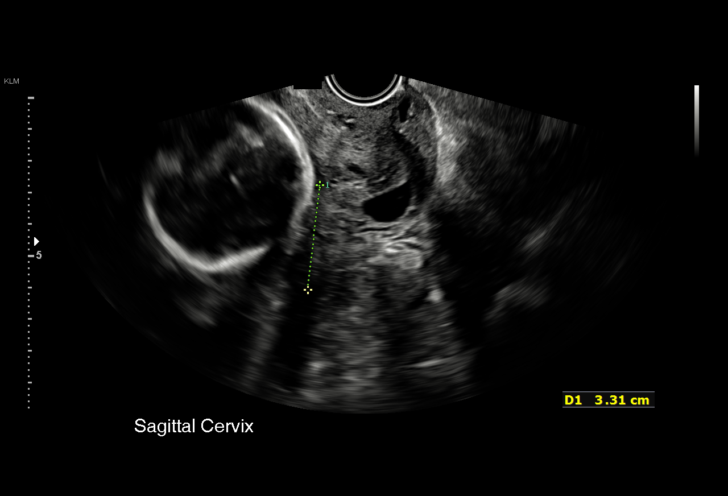
[im 35/35]
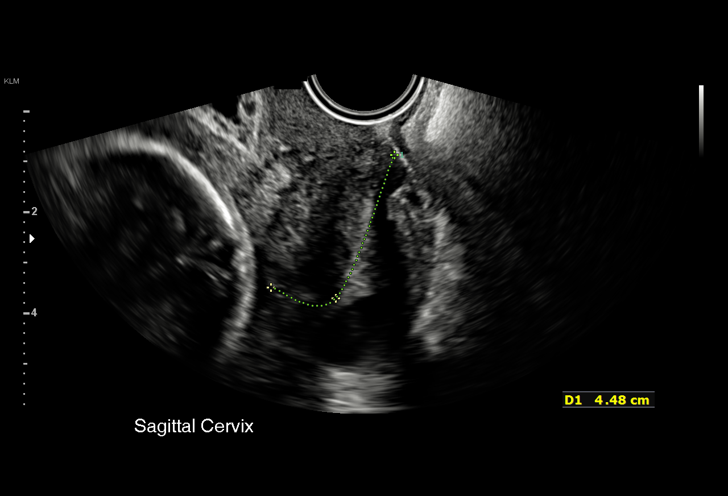

[14 of 28 positions shown; findings below may reference images not displayed]

Referred By:      TIJUANA RUTLAND              [facility]                              [[facility]]

 1  US MFM OB LIMITED                     76815.01    [facility]
 2  US MFM OB TRANSVAGINAL                76817.2     [facility]

Indications

 21 weeks gestation of pregnancy
 Pelvic pain affecting pregnancy in second
 trimester
 Cervical cerclage suture present, second
 trimester
 Poor obstetric history: Previous preterm
 delivery, antepartum
 Poor obstetric history: Previous midtrimester
 loss (add cerv insuff if applicable)
Fetal Evaluation

 Num Of Fetuses:         1
 Fetal Heart Rate(bpm):  157
 Cardiac Activity:       Observed
 Presentation:           Cephalic
 Placenta:               Posterior
 P. Cord Insertion:      Visualized

 Amniotic Fluid
 AFI FV:      Within normal limits

                             Largest Pocket(cm)


 Comment:    No placental abruption or previa identified.
OB History
 Gravidity:    7         Term:   1        Prem:   2        SAB:   1
 TOP:          2       Ectopic:  0        Living: 1
Gestational Age

 Best:          21w 4d     Det. By:  Early Ultrasound         EDD:   01/23/21
Anatomy

 Stomach:               Appears normal, left   Bladder:                Appears normal
                        sided
Cervix Uterus Adnexa

 Cervix
 Length:            4.5  cm.
 Cerclage visualized. Normal appearance by transabdominal scan.

 Uterus
 No abnormality visualized.

 Adnexa
 No abnormality visualized.
Comments

 Echogenic area seen on cervix, seen best transverse plane,
 significant??
 Placenta was seen low lying in earlier US
 Cervix looks long and closed.
Impression

 Patient is being evaluated in the [facility] for c/o pelvic pain. No
 history of vaginal bleeding. She had prophylactic cerclage on
 07/18/20 because of her history of preterm deliveries.

 A limited ultrasound study was performed .Amniotic fluid is
 normal and good fetal activity is seen. On transvaginal
 ultrasound, the cervix measures 4.5 cm, which is normal. The
 cerclage is in place and is closer to the external os. A bright
 scattered echogenic mass seen in the anterior cervix that
 could be a nabothean cyst.
                 Ppan, Jocelle

## 2020-09-16 NOTE — MAU Note (Signed)
Pt reorts she started having sharp pain in her vaginal area that started around 5pm. Pain is sharp starts in her groin and  Shoots down to her vagina. . Pt also reports having some clear discharge  As well. Has had discharge for several weeks but there is a little more today and it is clear instead of white. Pt has cerclage in place and pessary in place.

## 2020-09-16 NOTE — MAU Provider Note (Signed)
History     CSN: [id number]  Arrival date and time: 09/16/20 4196   Event Date/Time   First Provider Initiated Contact with Patient 09/16/20 2008      Chief Complaint  Patient presents with  . Abdominal Pain  . Vaginal Discharge   40 y.o. [id number] @21 .4 wks presenting with pelvic pain. Pain started around 1800. Describes as bilateral, sharp, intermittent and lasts just a few seconds. Denies spotting or VB. Reports a yellow vaginal discharge which is thinner than what she is used to. Denies cramping or tightening. Denies urinary sx. Denies lifting or strenuous activity. No recent sex.  OB History    Gravida  7   Para  3   Term  1   Preterm  2   AB  3   Living        SAB  1   IAB  2   Ectopic      Multiple      Live Births  1           Past Medical History:  Diagnosis Date  . Breast cyst 03/2014   left breast, benign  . Diverticulitis   . Medical history non-contributory   . Status post primary low transverse cesarean section 07/18/2020    Past Surgical History:  Procedure Laterality Date  . CERVICAL CERCLAGE    . CERVICAL CERCLAGE N/A 07/18/2020   Procedure: CERCLAGE CERVICAL;  Surgeon: 07/20/2020, DO;  Location: [facility];  Service: Gynecology;  Laterality: N/A;  . WISDOM TOOTH EXTRACTION      Family History  Problem Relation Age of Onset  . Hyperlipidemia Mother   . Hypertension Father   . Breast cancer Maternal Grandmother 94  . Coronary artery disease Maternal Grandmother   . Hypertension Maternal Grandmother   . Breast cancer Maternal Aunt        3 aunts with breast CA- diagnosed in their 16's    Social History   Tobacco Use  . Smoking status: Never Smoker  . Smokeless tobacco: Never Used  Vaping Use  . Vaping Use: Never used  Substance Use Topics  . Alcohol use: Not Currently    Comment: occasional  . Drug use: No    Allergies: No Known Allergies  Medications Prior to Admission  Medication Sig Dispense Refill  Last Dose  . Prenatal Vit-Fe Fumarate-FA (MULTIVITAMIN-PRENATAL) 27-0.8 MG TABS tablet Take 1 tablet by mouth every evening.   09/15/2020 at Unknown time  . acetaminophen (TYLENOL) 500 MG tablet Take 2 tablets (1,000 mg total) by mouth every 6 (six) hours as needed for moderate pain. 30 tablet 1 More than a month at Unknown time    Review of Systems  Gastrointestinal: Negative for abdominal pain.  Genitourinary: Positive for pelvic pain and vaginal discharge. Negative for dysuria, hematuria, urgency and vaginal bleeding.   Physical Exam   Blood pressure 124/64, pulse (!) 104, temperature 98.5 F (36.9 C), resp. rate 18, last menstrual period 12/26/2019.  Physical Exam Vitals and nursing note reviewed. Exam conducted with a chaperone present.  Constitutional:      General: She is not in acute distress.    Appearance: Normal appearance.  HENT:     Head: Normocephalic and atraumatic.  Cardiovascular:     Rate and Rhythm: Normal rate.  Pulmonary:     Effort: Pulmonary effort is normal. No respiratory distress.  Abdominal:     General: There is no distension.     Palpations: Abdomen is soft.  There is no mass.     Tenderness: There is no abdominal tenderness. There is no guarding or rebound.     Hernia: No hernia is present.  Genitourinary:    Comments: Pessary removed SSE: no pool, fern neg; thin white discharge SVE: closed/long; cerclage palpated at 11 o'clock  Musculoskeletal:        General: Normal range of motion.     Cervical back: Normal range of motion.  Skin:    General: Skin is warm and dry.  Neurological:     General: No focal deficit present.     Mental Status: She is alert and oriented to person, place, and time.  Psychiatric:        Mood and Affect: Mood normal.        Behavior: Behavior normal.   FHT: 154  Results for orders placed or performed during the hospital encounter of 09/16/20 (from the past 24 hour(s))  Urinalysis, Routine w reflex microscopic  Urine, Clean Catch     Status: Abnormal   Collection Time: 09/16/20  8:01 PM  Result Value Ref Range   Color, Urine STRAW (A) YELLOW   APPearance CLEAR CLEAR   Specific Gravity, Urine 1.005 1.005 - 1.030   pH 6.0 5.0 - 8.0   Glucose, UA NEGATIVE NEGATIVE mg/dL   Hgb urine dipstick NEGATIVE NEGATIVE   Bilirubin Urine NEGATIVE NEGATIVE   Ketones, ur NEGATIVE NEGATIVE mg/dL   Protein, ur NEGATIVE NEGATIVE mg/dL   Nitrite NEGATIVE NEGATIVE   Leukocytes,Ua NEGATIVE NEGATIVE  Wet prep, genital     Status: Abnormal   Collection Time: 09/16/20  8:38 PM   Specimen: Vaginal  Result Value Ref Range   Yeast Wet Prep HPF POC NONE SEEN NONE SEEN   Trich, Wet Prep NONE SEEN NONE SEEN   Clue Cells Wet Prep HPF POC NONE SEEN NONE SEEN   WBC, Wet Prep HPF POC MANY (A) NONE SEEN   Sperm NONE SEEN   POCT fern test     Status: None   Collection Time: 09/16/20  8:55 PM  Result Value Ref Range   POCT Fern Test Negative = intact amniotic membranes     [place] MFM OB Transvaginal  Result Date: 09/16/2020 ----------------------------------------------------------------------  OBSTETRICS REPORT                       (Signed Final 09/16/2020 09:51 pm) ---------------------------------------------------------------------- Patient Info  ID #:       [id number]                          D.O.B.:  [birth year] (39 yrs)  Name:       Victoria [name] [facility]                  Visit Date: 09/16/2020 09:15 pm ---------------------------------------------------------------------- Performed By  Attending:        Noralee Space MD        Ref. Address:     [address]  [place], [place]                                                             [address]  Performed By:     Marcellina Millin          Secondary Phy.:   [facility] MAU/Triage                    RDMS  Referred By:      Vikki Ports              [facility]                     Clee Pandit CNM                              [facility] ---------------------------------------------------------------------- Orders  #  Description                           Code        Ordered By  1  [place] MFM OB LIMITED                     76815.01    Amariona Rathje  2  [place] MFM OB TRANSVAGINAL                76817.2     Kharson Rasmusson ----------------------------------------------------------------------  #  Order #                     Accession #                Episode #  1  [id number]                   [id number]                 [id number]  2  [id number]                   [id number]                 [id number] ---------------------------------------------------------------------- Indications  [redacted] weeks gestation of pregnancy                Z3A.21  Pelvic pain affecting pregnancy in second      O26.892  trimester  Cervical cerclage suture present, second       O34.32  trimester  Poor obstetric history: Previous preterm       O09.219  delivery, antepartum  Poor obstetric history: Previous midtrimester  O09.299  loss (add cerv insuff if applicable) ---------------------------------------------------------------------- Fetal Evaluation  Num Of Fetuses:         1  Fetal Heart Rate(bpm):  157  Cardiac Activity:       Observed  Presentation:           Cephalic  Placenta:               Posterior  P. Cord Insertion:      Visualized  Amniotic Fluid  AFI FV:      Within normal limits  Largest Pocket(cm)                              4.9  Comment:    No placental abruption or previa identified. ---------------------------------------------------------------------- OB History  Gravidity:    7         Term:   1        Prem:   2        SAB:   1  TOP:          2       Ectopic:  0        Living: 1 ---------------------------------------------------------------------- Gestational Age  Best:          21w 4d     Det. By:  Marcella Dubs         EDD:   01/23/21  ---------------------------------------------------------------------- Anatomy  Stomach:               Appears normal, left   Bladder:                Appears normal                         sided ---------------------------------------------------------------------- Cervix Uterus Adnexa  Cervix  Length:            4.5  cm.  Cerclage visualized. Normal appearance by transabdominal scan.  Uterus  No abnormality visualized.  Adnexa  No abnormality visualized. ---------------------------------------------------------------------- Comments  Echogenic area seen on cervix, seen best transverse plane,  significant??  Placenta was seen low lying in earlier [place]  Cervix looks long and closed. ---------------------------------------------------------------------- Impression  Patient is being evaluated in the MAU for c/o pelvic pain. No  history of vaginal bleeding. She had prophylactic cerclage on  07/18/20 because of her history of preterm deliveries.  A limited ultrasound study was performed .Amniotic fluid is  normal and good fetal activity is seen. On transvaginal  ultrasound, the cervix measures 4.5 cm, which is normal. The  cerclage is in place and is closer to the external os. A bright  scattered echogenic mass seen in the anterior cervix that  could be a nabothean cyst. ----------------------------------------------------------------------                  Noralee Space, MD Electronically Signed Final Report   09/16/2020 09:51 pm ----------------------------------------------------------------------  [place] MFM OB LIMITED  Result Date: 09/16/2020 ----------------------------------------------------------------------  OBSTETRICS REPORT                       (Signed Final 09/16/2020 09:51 pm) ---------------------------------------------------------------------- Patient Info  ID #:       [id number]                          D.O.B.:  1981-05-27 (39 yrs)  Name:       Victoria [name] [facility]                  Visit Date: 09/16/2020 09:15 pm  ---------------------------------------------------------------------- Performed By  Attending:        Noralee Space MD        Ref. Address:     [address]  [address]                                                             [place], [place]                                                             [phone]  Performed By:     Marcellina MillinKelly L Moser          Secondary Phy.:   [facility]                    RDMS  Referred By:      Vikki PortsMELANIE N              Location:         [facility]                    Dylyn Mclaren CNM                              [facility] ---------------------------------------------------------------------- Orders  #  Description                           Code        Ordered By  1  US MFM OB LIMITED                     76815.01    Dolora Ridgely  2  US MFM OB TRANSVAGINAL                76817.2     Clayborn Milnes ----------------------------------------------------------------------  #  Order #                     Accession #                Episode #  1  [id number]                   [id number]                 [id number]  2  [id number]                   [id number]                 [id number] ---------------------------------------------------------------------- Indications  [redacted] weeks gestation of pregnancy                Z3A.21  Pelvic pain affecting pregnancy in second      O26.892  trimester  Cervical cerclage suture present, second       O34.32  trimester  Poor obstetric history: Previous preterm       O09.219  delivery, antepartum  Poor obstetric history: Previous midtrimester  O09.299  loss (add cerv insuff if applicable) ---------------------------------------------------------------------- Fetal Evaluation  Num Of Fetuses:         1  Fetal Heart Rate(bpm):  157  Cardiac Activity:  Observed  Presentation:           Cephalic  Placenta:               Posterior  P. Cord Insertion:      Visualized  Amniotic Fluid  AFI FV:      Within normal  limits                              Largest Pocket(cm)                              4.9  Comment:    No placental abruption or previa identified. ---------------------------------------------------------------------- OB History  Gravidity:    7         Term:   1        Prem:   2        SAB:   1  TOP:          2       Ectopic:  0        Living: 1 ---------------------------------------------------------------------- Gestational Age  Best:          21w 4d     Det. By:  Marcella Dubs         EDD:   01/23/21 ---------------------------------------------------------------------- Anatomy  Stomach:               Appears normal, left   Bladder:                Appears normal                         sided ---------------------------------------------------------------------- Cervix Uterus Adnexa  Cervix  Length:            4.5  cm.  Cerclage visualized. Normal appearance by transabdominal scan.  Uterus  No abnormality visualized.  Adnexa  No abnormality visualized. ---------------------------------------------------------------------- Comments  Echogenic area seen on cervix, seen best transverse plane,  significant??  Placenta was seen low lying in earlier [place]  Cervix looks long and closed. ---------------------------------------------------------------------- Impression  Patient is being evaluated in the MAU for c/o pelvic pain. No  history of vaginal bleeding. She had prophylactic cerclage on  07/18/20 because of her history of preterm deliveries.  A limited ultrasound study was performed .Amniotic fluid is  normal and good fetal activity is seen. On transvaginal  ultrasound, the cervix measures 4.5 cm, which is normal. The  cerclage is in place and is closer to the external os. A bright  scattered echogenic mass seen in the anterior cervix that  could be a nabothean cyst. ----------------------------------------------------------------------                  Noralee Space, MD Electronically Signed Final Report    09/16/2020 09:51 pm ----------------------------------------------------------------------  MAU Course  Procedures  MDM Review of prenatal records: high risk, AMA, hx of PTB x2, cerclage and pessary, low lying placenta. Labs and [place] ordered and reviewed. Pessary replaced w/o difficulty, pt tolerated well. No signs of PTL or PROM. US shows LLP resolved, CL normal, cerclage intact. Discussed ?nabothian cyst seen on [place] with Dr Judeth Cornfield, no concerns at this time. Pain likely RL type pain, discussed comfort measures with pt and partner. Discussed presentation, clinical findings, and plan with Dr. Earlene Plater, who agrees. Stable for discharge home.   Assessment and Plan  1. [redacted] weeks gestation of pregnancy   2. Pelvic pain affecting pregnancy   3. Pain of round ligament during pregnancy   4. Cervical cerclage suture present in second trimester    Discharge home Follow up at [facility] as scheduled PTL precautions . Allergies as of 09/16/2020   No Known Allergies     Medication List    TAKE these medications   acetaminophen 500 MG tablet Commonly known as: TYLENOL Take 2 tablets (1,000 mg total) by mouth every 6 (six) hours as needed for moderate pain.   multivitamin-prenatal 27-0.8 MG Tabs tablet Take 1 tablet by mouth every evening.       Donette Larry, CNM 09/16/2020, 10:34 PM

## 2020-09-16 NOTE — Discharge Instructions (Signed)

## 2020-09-17 LAB — GC/CHLAMYDIA PROBE AMP (~~LOC~~) NOT AT ARMC
Chlamydia: NEGATIVE
Comment: NEGATIVE
Comment: NORMAL
Neisseria Gonorrhea: NEGATIVE

## 2020-12-22 ENCOUNTER — Inpatient Hospital Stay (HOSPITAL_COMMUNITY)
Admission: AD | Admit: 2020-12-22 | Discharge: 2020-12-23 | Disposition: A | Discharge status: Home / Self Care | Payer: BC Managed Care – PPO | Attending: Obstetrics and Gynecology | Admitting: Obstetrics and Gynecology

## 2020-12-22 ENCOUNTER — Other Ambulatory Visit: Payer: Self-pay

## 2020-12-22 ENCOUNTER — Encounter (HOSPITAL_COMMUNITY): Payer: Self-pay | Admitting: Obstetrics and Gynecology

## 2020-12-22 DIAGNOSIS — O4693 Antepartum hemorrhage, unspecified, third trimester: Secondary | ICD-10-CM | POA: Insufficient documentation

## 2020-12-22 DIAGNOSIS — Z3A35 35 weeks gestation of pregnancy: Secondary | ICD-10-CM | POA: Diagnosis not present

## 2020-12-22 DIAGNOSIS — O3433 Maternal care for cervical incompetence, third trimester: Secondary | ICD-10-CM | POA: Diagnosis not present

## 2020-12-22 DIAGNOSIS — Z20822 Contact with and (suspected) exposure to covid-19: Secondary | ICD-10-CM | POA: Diagnosis not present

## 2020-12-22 LAB — RESP PANEL BY RT-PCR (FLU A&B, COVID) ARPGX2
Influenza A by PCR: NEGATIVE
Influenza B by PCR: NEGATIVE
SARS Coronavirus 2 by RT PCR: NEGATIVE

## 2020-12-22 LAB — CBC
HCT: 32.2 % — ABNORMAL LOW (ref 36.0–46.0)
Hemoglobin: 10.3 g/dL — ABNORMAL LOW (ref 12.0–15.0)
MCH: 27.4 pg (ref 26.0–34.0)
MCHC: 32 g/dL (ref 30.0–36.0)
MCV: 85.6 fL (ref 80.0–100.0)
Platelets: 227 10*3/uL (ref 150–400)
RBC: 3.76 MIL/uL — ABNORMAL LOW (ref 3.87–5.11)
RDW: 16.7 % — ABNORMAL HIGH (ref 11.5–15.5)
WBC: 6.7 10*3/uL (ref 4.0–10.5)
nRBC: 0 % (ref 0.0–0.2)

## 2020-12-22 MED ORDER — LACTATED RINGERS IV BOLUS
1000.0000 mL | Freq: Once | INTRAVENOUS | Status: AC
  Administered 2020-12-22: 1000 mL via INTRAVENOUS

## 2020-12-22 MED ORDER — BETAMETHASONE SOD PHOS & ACET 6 (3-3) MG/ML IJ SUSP
12.0000 mg | Freq: Once | INTRAMUSCULAR | Status: AC
  Administered 2020-12-22: 12 mg via INTRAMUSCULAR
  Filled 2020-12-22: qty 5

## 2020-12-22 MED ORDER — ACETAMINOPHEN 500 MG PO TABS
1000.0000 mg | ORAL_TABLET | Freq: Once | ORAL | Status: AC
  Administered 2020-12-23: 1000 mg via ORAL
  Filled 2020-12-22: qty 2

## 2020-12-22 NOTE — MAU Note (Addendum)
Pt presents to MAU with c/o contractions that are every 10 minutes apart that started around 6pm.  Pt also reports vaginal bleeding when she wipes. No leaking of fluid. +FM

## 2020-12-22 NOTE — MAU Provider Note (Signed)
History     CSN: [id number]  Arrival date and time: 12/22/20 2204   Event Date/Time   First Provider Initiated Contact with Patient 12/22/20 2209      Chief Complaint  Patient presents with  . Contractions  . Vaginal Bleeding   HPI Victoria [name] is a 40 y.o. [id number] at [redacted]w[redacted]d who presents with contractions & vaginal bleeding. History complicated by cervical insufficiency with 2 second trimester losses. Has a cerclage & pessary with this pregnancy.  Reports cramping & contractions every 10 minutes since Friday. Pain & pelvic pressure worsened this evening. Contractions now lasting 1-2 minutes at a time. Noticed pink discharge this evening. Denies LOF. Good fetal movement.   OB History    Gravida  7   Para  3   Term  1   Preterm  2   AB  3   Living  1     SAB  1   IAB  2   Ectopic      Multiple      Live Births  2           Past Medical History:  Diagnosis Date  . Breast cyst 03/2014   left breast, benign  . Diverticulitis     Past Surgical History:  Procedure Laterality Date  . CERVICAL CERCLAGE    . CERVICAL CERCLAGE N/A 07/18/2020   Procedure: CERCLAGE CERVICAL;  Surgeon: Edwinna Areola, DO;  Location: [facility] LD [facility]S;  Service: Gynecology;  Laterality: N/A;  . WISDOM TOOTH EXTRACTION      Family History  Problem Relation Age of Onset  . Hyperlipidemia Mother   . Hypertension Father   . Breast cancer Maternal Grandmother 39  . Coronary artery disease Maternal Grandmother   . Hypertension Maternal Grandmother   . Breast cancer Maternal Aunt        3 aunts with breast CA- diagnosed in their 10's    Social History   Tobacco Use  . Smoking status: Never Smoker  . Smokeless tobacco: Never Used  Vaping Use  . Vaping Use: Never used  Substance Use Topics  . Alcohol use: Not Currently    Comment: occasional  . Drug use: No    Allergies: No Known Allergies  Medications Prior to Admission  Medication Sig Dispense Refill Last Dose  .  ferrous sulfate 325 (65 FE) MG tablet Take 325 mg by mouth daily with breakfast.   12/22/2020 at Unknown time  . Prenatal Vit-Fe Fumarate-FA (MULTIVITAMIN-PRENATAL) 27-0.8 MG TABS tablet Take 1 tablet by mouth every evening.   12/22/2020 at Unknown time  . acetaminophen (TYLENOL) 500 MG tablet Take 2 tablets (1,000 mg total) by mouth every 6 (six) hours as needed for moderate pain. 30 tablet 1 Unknown at Unknown time    Review of Systems  Constitutional: Negative.   Gastrointestinal: Positive for abdominal pain. Negative for diarrhea, nausea and vomiting.  Genitourinary: Positive for pelvic pain and vaginal bleeding. Negative for dysuria and vaginal discharge.   Physical Exam   Blood pressure 131/82, pulse (!) 106, temperature 98.5 F (36.9 C), temperature source Oral, resp. rate 18, height 5\' 6"  (1.676 m), weight 104.3 kg, last menstrual period 12/26/2019, SpO2 99 %.  Physical Exam Constitutional:      General: She is in acute distress.     Appearance: Normal appearance.  HENT:     Head: Normocephalic and atraumatic.  Eyes:     General: No scleral icterus. Pulmonary:     Effort: Pulmonary  effort is normal. No respiratory distress.  Abdominal:     Palpations: Abdomen is soft.     Tenderness: There is no abdominal tenderness.     Comments: Gravid uterus  Genitourinary:    Comments: Pessary removed. Unable to visualize cervix during speculum exam but did see part of cerclage - unsure attachment. Proceeded with digital exam - unable to palpate cerclage. Small amount of dark red/brown mucoid discharge  Dilation: 1 Effacement (%): 70 Cervical Position: Middle Station: -3 Presentation: Vertex Exam by:: Judeth Horn, NP  Skin:    General: Skin is warm and dry.  Neurological:     Mental Status: She is alert.  Psychiatric:        Mood and Affect: Mood normal.        Behavior: Behavior normal.    NST:  Baseline: 145 bpm, Variability: Good {> 6 bpm), Accelerations: Reactive and  Decelerations: Absent   BSUS: Pt informed that the ultrasound is considered a limited OB ultrasound and is not intended to be a complete ultrasound exam.  Patient also informed that the ultrasound is not being completed with the intent of assessing for fetal or placental anomalies or any pelvic abnormalities.  Explained that the purpose of today's ultrasound is to assess for  presentation.  Patient acknowledges the purpose of the exam and the limitations of the study.  Cephalic  MAU Course  Procedures Results for orders placed or performed during the hospital encounter of 12/22/20 (from the past 24 hour(s))  Resp Panel by RT-PCR (Flu A&B, Covid) Nasopharyngeal Swab     Status: None   Collection Time: 12/22/20 10:25 PM   Specimen: Nasopharyngeal Swab; Nasopharyngeal(NP) swabs in vial transport medium  Result Value Ref Range   SARS Coronavirus 2 by RT PCR NEGATIVE NEGATIVE   Influenza A by PCR NEGATIVE NEGATIVE   Influenza B by PCR NEGATIVE NEGATIVE    MDM Dr. Reina Fuse called MAU prior to patients arrival to notify us regarding patient's history  Exam performed after pessary removed. Cervix 1/70 with some bloody show.  Dr. Reina Fuse notified of patient's exam & will come see patient for admission.  IV fluids, admission labs, & betamethasone ordered   Assessment and Plan  Preterm contractions Hx cervical insufficiency Cervical cerclage in place [redacted] wks gestation  -Dr. Reina Fuse on unit to manage patient's care.    Judeth Horn 12/22/2020, 11:24 PM

## 2020-12-23 ENCOUNTER — Other Ambulatory Visit: Payer: Self-pay

## 2020-12-23 ENCOUNTER — Inpatient Hospital Stay (HOSPITAL_COMMUNITY)
Admission: AD | Admit: 2020-12-23 | Discharge: 2020-12-24 | Disposition: A | Discharge status: Home / Self Care | Payer: BC Managed Care – PPO | Source: Home / Self Care | Attending: Obstetrics and Gynecology | Admitting: Obstetrics and Gynecology

## 2020-12-23 DIAGNOSIS — Z3A36 36 weeks gestation of pregnancy: Secondary | ICD-10-CM | POA: Insufficient documentation

## 2020-12-23 DIAGNOSIS — Z3483 Encounter for supervision of other normal pregnancy, third trimester: Secondary | ICD-10-CM | POA: Insufficient documentation

## 2020-12-23 LAB — WET PREP, GENITAL
Sperm: NONE SEEN
Trich, Wet Prep: NONE SEEN
Yeast Wet Prep HPF POC: NONE SEEN

## 2020-12-23 LAB — GC/CHLAMYDIA PROBE AMP (~~LOC~~) NOT AT ARMC
Chlamydia: NEGATIVE
Comment: NEGATIVE
Comment: NORMAL
Neisseria Gonorrhea: NEGATIVE

## 2020-12-23 LAB — TYPE AND SCREEN
ABO/RH(D): B POS
Antibody Screen: NEGATIVE

## 2020-12-23 LAB — RPR: RPR Ser Ql: NONREACTIVE

## 2020-12-23 MED ORDER — DOCUSATE SODIUM 100 MG PO CAPS
100.0000 mg | ORAL_CAPSULE | Freq: Once | ORAL | Status: AC
  Administered 2020-12-23: 100 mg via ORAL
  Filled 2020-12-23: qty 1

## 2020-12-23 MED ORDER — DOCUSATE SODIUM 100 MG PO CAPS: 100.0000 mg | ORAL_CAPSULE | Freq: Two times a day (BID) | ORAL | 0 refills | Status: AC

## 2020-12-23 MED ORDER — BETAMETHASONE SOD PHOS & ACET 6 (3-3) MG/ML IJ SUSP
12.0000 mg | Freq: Once | INTRAMUSCULAR | Status: AC
  Administered 2020-12-24: 12 mg via INTRAMUSCULAR

## 2020-12-23 NOTE — MAU Note (Signed)
PT SAYS NO C/O - ONLY HERE FOR INJECTION .

## 2020-12-23 NOTE — MAU Provider Note (Signed)
History      Chief Complaint  Patient presents with  . Contractions  . Vaginal Bleeding   Patient presents tonight for evaluation of possible contractions and reported vaginal bleeding in setting of known cervical cerclage (placed by CB on 07/18/20). +FM, denies LOF. Having scant brown discharge on pantyliner that has remained unchanged per patient report for several weeks. Called triage on Friday with reports of cramping but no VB, advised on heat tylenol and water. States she did all of the above but did not take Tylenol. Cramping steadily grew worse. Was laying in bed this evening when she noticed light red thin streak when wiping x2 after urination but NOT in toilet or on pantyliner. Notes semi-regular BM but does endorse hard stool. Presented to MAU for eval given history. Denies intercourse, heavy lifting, falls, etc  Vaginal Bleeding The patient's primary symptoms include pelvic pain. Pertinent negatives include no abdominal pain, chills, fever, flank pain, headaches, nausea or vomiting.    OB History    Gravida  7   Para  3   Term  1   Preterm  2   AB  3   Living  1     SAB  1   IAB  2   Ectopic      Multiple      Live Births  2           Past Medical History:  Diagnosis Date  . Breast cyst 03/2014   left breast, benign  . Diverticulitis     Past Surgical History:  Procedure Laterality Date  . CERVICAL CERCLAGE    . CERVICAL CERCLAGE N/A 07/18/2020   Procedure: CERCLAGE CERVICAL;  Surgeon: Edwinna Areola, DO;  Location: [facility];  Service: Gynecology;  Laterality: N/A;  . WISDOM TOOTH EXTRACTION      Family History  Problem Relation Age of Onset  . Hyperlipidemia Mother   . Hypertension Father   . Breast cancer Maternal Grandmother 18  . Coronary artery disease Maternal Grandmother   . Hypertension Maternal Grandmother   . Breast cancer Maternal Aunt        3 aunts with breast CA- diagnosed in their 59's    Social History    Tobacco Use  . Smoking status: Never Smoker  . Smokeless tobacco: Never Used  Vaping Use  . Vaping Use: Never used  Substance Use Topics  . Alcohol use: Not Currently    Comment: occasional  . Drug use: No    Allergies: No Known Allergies  Medications Prior to Admission  Medication Sig Dispense Refill Last Dose  . ferrous sulfate 325 (65 FE) MG tablet Take 325 mg by mouth daily with breakfast.   12/22/2020 at Unknown time  . Prenatal Vit-Fe Fumarate-FA (MULTIVITAMIN-PRENATAL) 27-0.8 MG TABS tablet Take 1 tablet by mouth every evening.   12/22/2020 at Unknown time  . acetaminophen (TYLENOL) 500 MG tablet Take 2 tablets (1,000 mg total) by mouth every 6 (six) hours as needed for moderate pain. 30 tablet 1 Unknown at Unknown time    Review of Systems  Constitutional: Negative for chills and fever.  Respiratory: Negative for shortness of breath.   Cardiovascular: Negative for chest pain, palpitations and leg swelling.  Gastrointestinal: Negative for abdominal distention, abdominal pain, nausea and vomiting.  Genitourinary: Positive for pelvic pain, vaginal bleeding and vaginal pain. Negative for flank pain.  Neurological: Negative for dizziness, weakness and headaches.  Psychiatric/Behavioral: Negative for suicidal ideas.   Physical Exam  Blood pressure 131/82, pulse (!) 106, temperature 98.5 F (36.9 C), temperature source Oral, resp. rate 18, height 5\' 6"  (1.676 m), weight 104.3 kg, last menstrual period 12/26/2019, SpO2 99 %.  Physical Exam Exam conducted with a chaperone present.  Constitutional:      General: She is not in acute distress.    Appearance: She is well-developed.  HENT:     Head: Normocephalic and atraumatic.  Eyes:     Pupils: Pupils are equal, round, and reactive to light.  Cardiovascular:     Rate and Rhythm: Normal rate and regular rhythm.     Heart sounds: No murmur heard. No gallop.   Abdominal:     Tenderness: There is no abdominal tenderness.  There is no guarding or rebound.  Genitourinary:    Exam position: Lithotomy position.     Pubic Area: No rash.      Vagina: Normal. No vaginal discharge, erythema or bleeding.     Cervix: Friability present. No cervical motion tenderness or lesion.     Uterus: With uterine prolapse.         Comments: Patient placed in dorsal lithotomy. Pessary already removed by MAU provider. Cervix not visible at introitus. Gentle Graves speculum insertion reveals findings as above: 2 knots in place consistent with [employer] cerclage op note from 07/18/20. Knots and suture intact, no evidence of VB nor cervical injury, no extrusion with valsalva. External os approx 1cm however int os closed. At 6 o clock, 0.5cm ectocervical polypoid tissue noted, minorly friable. However this was only spotting noted during exam.  Musculoskeletal:        General: Normal range of motion.     Cervical back: Normal range of motion and neck supple.  Skin:    General: Skin is warm and dry.  Neurological:     Mental Status: She is alert and oriented to person, place, and time.    Category 1 tracing TOCO w/ uterine irritability Palpable hard stool through posterior vaginal vault  Assessment and Plan  This is a 40yo 07/20/20 @ 35 3/7 initially presenting with concerns for preterm labor and vaginal bleeding in the setting of known cervical cerclage placed for h/o cervical incompetence. Already on weekly Makena injections. Physical exam done in detail as above with no evidence of active vaginal bleeding with sutures x2 in place. Given patient history, BMTZ x1 ordered as well as infectious swabs. Will administer IV fluid boluis as well as Tylenol 1000mg  to see if this improves reported cramping.   Reassessed patient after two hours, much more comfortable. Speculum exam repeat with no change in findings, stable cervical exam. Patient has appt this Thursday for cerclage removal in-office. Will bring back to MAU for repeat BMTZ dose,  reviewed safety profile for PO Tylenol in pregnancy. Will also send in Rx for colace 100mg  BID PRN for constipation.   All questions answered, patient discharged home in stable fashion   12/23/2020, 2:23 AM

## 2020-12-24 LAB — CULTURE, BETA STREP (GROUP B ONLY)

## 2021-01-07 ENCOUNTER — Encounter (HOSPITAL_COMMUNITY)
Admission: AD | Disposition: A | Discharge status: Home / Self Care | Payer: Self-pay | Source: Home / Self Care | Attending: Obstetrics and Gynecology

## 2021-01-07 ENCOUNTER — Other Ambulatory Visit: Payer: Self-pay

## 2021-01-07 ENCOUNTER — Inpatient Hospital Stay (HOSPITAL_COMMUNITY): Payer: BC Managed Care – PPO | Admitting: Anesthesiology

## 2021-01-07 ENCOUNTER — Inpatient Hospital Stay (HOSPITAL_COMMUNITY): Payer: BC Managed Care – PPO | Admitting: Certified Registered Nurse Anesthetist

## 2021-01-07 ENCOUNTER — Inpatient Hospital Stay (HOSPITAL_COMMUNITY)
Admission: AD | Admit: 2021-01-07 | Discharge: 2021-01-09 | DRG: 798 | Disposition: A | Discharge status: Home / Self Care | Payer: BC Managed Care – PPO | Attending: Obstetrics and Gynecology | Admitting: Obstetrics and Gynecology

## 2021-01-07 ENCOUNTER — Encounter (HOSPITAL_COMMUNITY): Payer: Self-pay | Admitting: Obstetrics and Gynecology

## 2021-01-07 DIAGNOSIS — Z302 Encounter for sterilization: Secondary | ICD-10-CM | POA: Diagnosis not present

## 2021-01-07 DIAGNOSIS — Z8616 Personal history of COVID-19: Secondary | ICD-10-CM | POA: Diagnosis not present

## 2021-01-07 DIAGNOSIS — Z3A37 37 weeks gestation of pregnancy: Secondary | ICD-10-CM | POA: Diagnosis not present

## 2021-01-07 DIAGNOSIS — N812 Incomplete uterovaginal prolapse: Secondary | ICD-10-CM | POA: Diagnosis present

## 2021-01-07 DIAGNOSIS — O99824 Streptococcus B carrier state complicating childbirth: Secondary | ICD-10-CM | POA: Diagnosis present

## 2021-01-07 DIAGNOSIS — O26893 Other specified pregnancy related conditions, third trimester: Secondary | ICD-10-CM | POA: Diagnosis present

## 2021-01-07 DIAGNOSIS — O3443 Maternal care for other abnormalities of cervix, third trimester: Secondary | ICD-10-CM | POA: Diagnosis present

## 2021-01-07 DIAGNOSIS — O99215 Obesity complicating the puerperium: Secondary | ICD-10-CM | POA: Diagnosis present

## 2021-01-07 HISTORY — PX: TUBAL LIGATION: SHX77

## 2021-01-07 LAB — CBC
HCT: 34.3 % — ABNORMAL LOW (ref 36.0–46.0)
Hemoglobin: 10.9 g/dL — ABNORMAL LOW (ref 12.0–15.0)
MCH: 27.2 pg (ref 26.0–34.0)
MCHC: 31.8 g/dL (ref 30.0–36.0)
MCV: 85.5 fL (ref 80.0–100.0)
Platelets: 213 10*3/uL (ref 150–400)
RBC: 4.01 MIL/uL (ref 3.87–5.11)
RDW: 16.7 % — ABNORMAL HIGH (ref 11.5–15.5)
WBC: 6.7 10*3/uL (ref 4.0–10.5)
nRBC: 0 % (ref 0.0–0.2)

## 2021-01-07 LAB — RESP PANEL BY RT-PCR (FLU A&B, COVID) ARPGX2
Influenza A by PCR: NEGATIVE
Influenza B by PCR: NEGATIVE
SARS Coronavirus 2 by RT PCR: NEGATIVE

## 2021-01-07 LAB — POCT FERN TEST: POCT Fern Test: POSITIVE

## 2021-01-07 LAB — TYPE AND SCREEN
ABO/RH(D): B POS
Antibody Screen: NEGATIVE

## 2021-01-07 LAB — RPR: RPR Ser Ql: NONREACTIVE

## 2021-01-07 SURGERY — LIGATION, FALLOPIAN TUBE, POSTPARTUM
Anesthesia: Choice

## 2021-01-07 MED ORDER — SIMETHICONE 80 MG PO CHEW: 80.0000 mg | CHEWABLE_TABLET | ORAL | Status: DC | PRN

## 2021-01-07 MED ORDER — FENTANYL-BUPIVACAINE-NACL 0.5-0.125-0.9 MG/250ML-% EP SOLN
12.0000 mL/h | EPIDURAL | Status: DC | PRN
  Filled 2021-01-07: qty 250

## 2021-01-07 MED ORDER — EPHEDRINE 5 MG/ML INJ: 10.0000 mg | INTRAVENOUS | Status: DC | PRN

## 2021-01-07 MED ORDER — TETANUS-DIPHTH-ACELL PERTUSSIS 5-2.5-18.5 LF-MCG/0.5 IM SUSY: 0.5000 mL | PREFILLED_SYRINGE | Freq: Once | INTRAMUSCULAR | Status: DC

## 2021-01-07 MED ORDER — KETOROLAC TROMETHAMINE 30 MG/ML IJ SOLN
INTRAMUSCULAR | Status: DC | PRN
  Administered 2021-01-07: 30 mg via INTRAVENOUS

## 2021-01-07 MED ORDER — PRENATAL MULTIVITAMIN CH: 1.0000 | ORAL_TABLET | Freq: Every day | ORAL | Status: DC

## 2021-01-07 MED ORDER — OXYTOCIN-SODIUM CHLORIDE 30-0.9 UT/500ML-% IV SOLN
2.5000 [IU]/h | INTRAVENOUS | Status: DC
  Administered 2021-01-07: 2.5 [IU]/h via INTRAVENOUS
  Filled 2021-01-07: qty 500

## 2021-01-07 MED ORDER — ZOLPIDEM TARTRATE 5 MG PO TABS: 5.0000 mg | ORAL_TABLET | Freq: Every evening | ORAL | Status: DC | PRN

## 2021-01-07 MED ORDER — DOCUSATE SODIUM 100 MG PO CAPS
100.0000 mg | ORAL_CAPSULE | Freq: Two times a day (BID) | ORAL | Status: DC
  Administered 2021-01-08 – 2021-01-09 (×3): 100 mg via ORAL
  Filled 2021-01-07 (×3): qty 1

## 2021-01-07 MED ORDER — LACTATED RINGERS IV SOLN
500.0000 mL | Freq: Once | INTRAVENOUS | Status: AC
Start: 2021-01-07 — End: 2021-01-07
  Administered 2021-01-07: 500 mL via INTRAVENOUS

## 2021-01-07 MED ORDER — WITCH HAZEL-GLYCERIN EX PADS: 1.0000 "application " | MEDICATED_PAD | CUTANEOUS | Status: DC | PRN

## 2021-01-07 MED ORDER — DIPHENHYDRAMINE HCL 25 MG PO CAPS: 25.0000 mg | ORAL_CAPSULE | Freq: Four times a day (QID) | ORAL | Status: DC | PRN

## 2021-01-07 MED ORDER — TERBUTALINE SULFATE 1 MG/ML IJ SOLN: 0.2500 mg | Freq: Once | INTRAMUSCULAR | Status: DC | PRN

## 2021-01-07 MED ORDER — PHENYLEPHRINE 40 MCG/ML (10ML) SYRINGE FOR IV PUSH (FOR BLOOD PRESSURE SUPPORT): 80.0000 ug | PREFILLED_SYRINGE | INTRAVENOUS | Status: DC | PRN

## 2021-01-07 MED ORDER — ONDANSETRON HCL 4 MG/2ML IJ SOLN
INTRAMUSCULAR | Status: DC | PRN
  Administered 2021-01-07: 4 mg via INTRAVENOUS

## 2021-01-07 MED ORDER — LACTATED RINGERS IV SOLN: 500.0000 mL | INTRAVENOUS | Status: DC | PRN

## 2021-01-07 MED ORDER — PENICILLIN G POT IN DEXTROSE 60000 UNIT/ML IV SOLN
3.0000 10*6.[IU] | INTRAVENOUS | Status: DC
  Administered 2021-01-07: 3 10*6.[IU] via INTRAVENOUS
  Filled 2021-01-07: qty 50

## 2021-01-07 MED ORDER — ONDANSETRON HCL 4 MG PO TABS: 4.0000 mg | ORAL_TABLET | ORAL | Status: DC | PRN

## 2021-01-07 MED ORDER — KETOROLAC TROMETHAMINE 30 MG/ML IJ SOLN
INTRAMUSCULAR | Status: AC
  Filled 2021-01-07: qty 1

## 2021-01-07 MED ORDER — ONDANSETRON HCL 4 MG/2ML IJ SOLN: 4.0000 mg | Freq: Four times a day (QID) | INTRAMUSCULAR | Status: DC | PRN

## 2021-01-07 MED ORDER — DEXAMETHASONE SODIUM PHOSPHATE 10 MG/ML IJ SOLN
INTRAMUSCULAR | Status: DC | PRN
  Administered 2021-01-07: 10 mg via INTRAVENOUS

## 2021-01-07 MED ORDER — BUPIVACAINE HCL (PF) 0.25 % IJ SOLN
INTRAMUSCULAR | Status: AC
  Filled 2021-01-07: qty 10

## 2021-01-07 MED ORDER — OXYCODONE-ACETAMINOPHEN 5-325 MG PO TABS
2.0000 | ORAL_TABLET | ORAL | Status: DC | PRN
Start: 2021-01-07 — End: 2021-01-07

## 2021-01-07 MED ORDER — OXYCODONE-ACETAMINOPHEN 5-325 MG PO TABS: 1.0000 | ORAL_TABLET | ORAL | Status: DC | PRN

## 2021-01-07 MED ORDER — OXYTOCIN-SODIUM CHLORIDE 30-0.9 UT/500ML-% IV SOLN
1.0000 m[IU]/min | INTRAVENOUS | Status: DC
  Administered 2021-01-07: 2 m[IU]/min via INTRAVENOUS

## 2021-01-07 MED ORDER — OXYTOCIN BOLUS FROM INFUSION: 333.0000 mL | Freq: Once | INTRAVENOUS | Status: DC

## 2021-01-07 MED ORDER — SOD CITRATE-CITRIC ACID 500-334 MG/5ML PO SOLN: 30.0000 mL | ORAL | Status: DC | PRN

## 2021-01-07 MED ORDER — BUPIVACAINE HCL (PF) 0.25 % IJ SOLN
INTRAMUSCULAR | Status: DC | PRN
  Administered 2021-01-07: 10 mL

## 2021-01-07 MED ORDER — LIDOCAINE-EPINEPHRINE (PF) 2 %-1:200000 IJ SOLN
INTRAMUSCULAR | Status: DC | PRN
  Administered 2021-01-07 (×2): 5 mL via INTRADERMAL

## 2021-01-07 MED ORDER — BENZOCAINE-MENTHOL 20-0.5 % EX AERO
1.0000 "application " | INHALATION_SPRAY | CUTANEOUS | Status: DC | PRN
  Administered 2021-01-07: 1 via TOPICAL
  Filled 2021-01-07: qty 56

## 2021-01-07 MED ORDER — ONDANSETRON HCL 4 MG/2ML IJ SOLN: 4.0000 mg | INTRAMUSCULAR | Status: DC | PRN

## 2021-01-07 MED ORDER — FENTANYL CITRATE (PF) 100 MCG/2ML IJ SOLN
INTRAMUSCULAR | Status: AC
  Filled 2021-01-07: qty 2

## 2021-01-07 MED ORDER — IBUPROFEN 600 MG PO TABS
600.0000 mg | ORAL_TABLET | Freq: Four times a day (QID) | ORAL | Status: DC
  Administered 2021-01-07 – 2021-01-09 (×5): 600 mg via ORAL
  Filled 2021-01-07 (×5): qty 1

## 2021-01-07 MED ORDER — FENTANYL CITRATE (PF) 100 MCG/2ML IJ SOLN
INTRAMUSCULAR | Status: DC | PRN
  Administered 2021-01-07: 100 ug via INTRAVENOUS

## 2021-01-07 MED ORDER — OXYCODONE HCL 5 MG PO TABS
5.0000 mg | ORAL_TABLET | ORAL | Status: DC | PRN
  Administered 2021-01-08 – 2021-01-09 (×3): 5 mg via ORAL
  Filled 2021-01-07 (×3): qty 1

## 2021-01-07 MED ORDER — DIBUCAINE (PERIANAL) 1 % EX OINT: 1.0000 "application " | TOPICAL_OINTMENT | CUTANEOUS | Status: DC | PRN

## 2021-01-07 MED ORDER — ONDANSETRON HCL 4 MG/2ML IJ SOLN
INTRAMUSCULAR | Status: AC
  Filled 2021-01-07: qty 2

## 2021-01-07 MED ORDER — ACETAMINOPHEN 325 MG PO TABS
650.0000 mg | ORAL_TABLET | ORAL | Status: DC | PRN
  Administered 2021-01-08 (×2): 650 mg via ORAL
  Filled 2021-01-07 (×2): qty 2

## 2021-01-07 MED ORDER — SODIUM CHLORIDE 0.9 % IV SOLN
5.0000 10*6.[IU] | Freq: Once | INTRAVENOUS | Status: AC
  Administered 2021-01-07: 5 10*6.[IU] via INTRAVENOUS
  Filled 2021-01-07: qty 5

## 2021-01-07 MED ORDER — DEXAMETHASONE SODIUM PHOSPHATE 10 MG/ML IJ SOLN
INTRAMUSCULAR | Status: AC
  Filled 2021-01-07: qty 1

## 2021-01-07 MED ORDER — FLEET ENEMA 7-19 GM/118ML RE ENEM: 1.0000 | ENEMA | RECTAL | Status: DC | PRN

## 2021-01-07 MED ORDER — COCONUT OIL OIL: 1.0000 "application " | TOPICAL_OIL | Status: DC | PRN

## 2021-01-07 MED ORDER — LIDOCAINE HCL (PF) 1 % IJ SOLN: 30.0000 mL | INTRAMUSCULAR | Status: DC | PRN

## 2021-01-07 MED ORDER — LIDOCAINE HCL (PF) 1 % IJ SOLN
INTRAMUSCULAR | Status: DC | PRN
  Administered 2021-01-07: 5 mL via EPIDURAL

## 2021-01-07 MED ORDER — ACETAMINOPHEN 325 MG PO TABS: 650.0000 mg | ORAL_TABLET | ORAL | Status: DC | PRN

## 2021-01-07 MED ORDER — LACTATED RINGERS IV SOLN: INTRAVENOUS | Status: DC

## 2021-01-07 MED ORDER — OXYCODONE HCL 5 MG PO TABS: 10.0000 mg | ORAL_TABLET | ORAL | Status: DC | PRN

## 2021-01-07 MED ORDER — FENTANYL-BUPIVACAINE-NACL 0.5-0.125-0.9 MG/250ML-% EP SOLN
EPIDURAL | Status: DC | PRN
  Administered 2021-01-07: 12 mL/h via EPIDURAL

## 2021-01-07 MED ORDER — DIPHENHYDRAMINE HCL 50 MG/ML IJ SOLN: 12.5000 mg | INTRAMUSCULAR | Status: DC | PRN

## 2021-01-07 MED ORDER — BUTORPHANOL TARTRATE 1 MG/ML IJ SOLN
1.0000 mg | INTRAMUSCULAR | Status: DC | PRN
Start: 2021-01-07 — End: 2021-01-07
  Filled 2021-01-07: qty 1

## 2021-01-07 SURGICAL SUPPLY — 27 items
APL PRP STRL LF DISP 70% ISPRP (MISCELLANEOUS) ×2
APL SKNCLS STERI-STRIP NONHPOA (GAUZE/BANDAGES/DRESSINGS) ×1
BENZOIN TINCTURE PRP APPL 2/3 (GAUZE/BANDAGES/DRESSINGS) ×1 IMPLANT
CHLORAPREP W/TINT 26 (MISCELLANEOUS) ×4 IMPLANT
CLOSURE STERI STRIP 1/2 X4 (GAUZE/BANDAGES/DRESSINGS) ×1 IMPLANT
CLOTH BEACON ORANGE TIMEOUT ST (SAFETY) ×2 IMPLANT
DRSG OPSITE POSTOP 3X4 (GAUZE/BANDAGES/DRESSINGS) ×2 IMPLANT
ELECT REM PT RETURN 9FT ADLT (ELECTROSURGICAL) ×2
ELECTRODE REM PT RTRN 9FT ADLT (ELECTROSURGICAL) IMPLANT
GLOVE BIO SURGEON STRL SZ 6 (GLOVE) ×2 IMPLANT
GLOVE BIOGEL PI IND STRL 6.5 (GLOVE) ×1 IMPLANT
GLOVE BIOGEL PI IND STRL 7.0 (GLOVE) ×1 IMPLANT
GLOVE BIOGEL PI INDICATOR 6.5 (GLOVE) ×1
GLOVE BIOGEL PI INDICATOR 7.0 (GLOVE) ×1
GOWN STRL REUS W/TWL LRG LVL3 (GOWN DISPOSABLE) ×4 IMPLANT
NS IRRIG 1000ML POUR BTL (IV SOLUTION) ×2 IMPLANT
PACK ABDOMINAL MINOR (CUSTOM PROCEDURE TRAY) ×2 IMPLANT
SET BERKELEY SUCTION TUBING (SUCTIONS) ×1 IMPLANT
SPONGE LAP 4X18 RFD (DISPOSABLE) IMPLANT
SUT PLAIN 0 NONE (SUTURE) ×2 IMPLANT
SUT VIC AB 0 CT1 27 (SUTURE) ×2
SUT VIC AB 0 CT1 27XBRD ANBCTR (SUTURE) ×1 IMPLANT
SUT VIC AB 4-0 KS 27 (SUTURE) ×2 IMPLANT
TOWEL OR 17X24 6PK STRL BLUE (TOWEL DISPOSABLE) ×4 IMPLANT
TRAY FOLEY W/BAG SLVR 14FR LF (SET/KITS/TRAYS/PACK) ×2 IMPLANT
WATER STERILE IRR 1000ML POUR (IV SOLUTION) ×2 IMPLANT
YANKAUER SUCT BULB TIP NO VENT (SUCTIONS) ×1 IMPLANT

## 2021-01-07 NOTE — H&P (Signed)
Victoria [name] is a 40 y.o. female, G7 P1231, EGA 37+ weeks with EDC 7-7 presenting for leaking fluid since 0100.  On eval in MAU, ROM confirmed with positive fern, irreg ctx, VE still 1/50.  PNC complicated by h/o incompetent cervix with 2 deliveries at 22 and 23 weeks that did not survive, had cerclage with both of those pregnancies.  Had cerclage placed with this pregnancy-removed at 36 weeks, also used pessary for history and cervical prolapse, and received Makena.  Also with AMA and obesity, on baby ASA, NIPT low risk.  She had COVID in December.  She received a course of betamethasone June 5 and 6.  OB History     Gravida  7   Para  3   Term  1   Preterm  2   AB  3   Living  1      SAB  1   IAB  2   Ectopic      Multiple      Live Births  2          Past Medical History:  Diagnosis Date   Breast cyst 03/2014   left breast, benign   Diverticulitis    Past Surgical History:  Procedure Laterality Date   CERVICAL CERCLAGE     CERVICAL CERCLAGE N/A 07/18/2020   Procedure: CERCLAGE CERVICAL;  Surgeon: Edwinna Areola, DO;  Location: [facility];  Service: Gynecology;  Laterality: N/A;   WISDOM TOOTH EXTRACTION     Family History: family history includes Breast cancer in her maternal aunt; Breast cancer (age of onset: 39) in her maternal grandmother; Coronary artery disease in her maternal grandmother; Hyperlipidemia in her mother; Hypertension in her father and maternal grandmother. Social History:  reports that she has never smoked. She has never used smokeless tobacco. She reports previous alcohol use. She reports that she does not use drugs.     Maternal Diabetes: No Genetic Screening: Normal Maternal Ultrasounds/Referrals: Normal Fetal Ultrasounds or other Referrals:  None Maternal Substance Abuse:  No Significant Maternal Medications:  Meds include: Progesterone Other:  Significant Maternal Lab Results:  Group B Strep positive Other Comments:    received BMZ 6-5 and 6-6  Review of Systems  Respiratory: Negative.    Cardiovascular: Negative.   Maternal Medical History:  Reason for admission: Rupture of membranes and contractions.   Contractions: Frequency: irregular.   Perceived severity is moderate.   Fetal activity: Perceived fetal activity is normal.   Prenatal Complications - Diabetes: none.  Dilation: 1 Effacement (%): 50 Station: -2 Blood pressure 129/83, pulse 97, temperature 98.4 F (36.9 C), temperature source Oral, resp. rate 18, height 5\' 6"  (1.676 m), weight 105.2 kg, last menstrual period 12/26/2019. Maternal Exam:  Uterine Assessment: Contraction strength is moderate.  Contraction frequency is irregular.  Abdomen: Patient reports no abdominal tenderness. Estimated fetal weight is 7 lbs.   Fetal presentation: vertex Introitus: Normal vulva. Normal vagina.  Ferning test: positive.  Amniotic fluid character: clear. Pelvis: adequate for delivery.     Fetal Exam Fetal Monitor Review: Mode: ultrasound.   Baseline rate: 130-140.  Variability: moderate (6-25 bpm).   Pattern: accelerations present and no decelerations.   Fetal State Assessment: Category I - tracings are normal.  Physical Exam Vitals reviewed.  Constitutional:      Appearance: Normal appearance.  Cardiovascular:     Rate and Rhythm: Normal rate and regular rhythm.  Pulmonary:     Effort: Pulmonary effort is normal. No  respiratory distress.  Abdominal:     Palpations: Abdomen is soft.  Genitourinary:    General: Normal vulva.  Neurological:     Mental Status: She is alert.    Prenatal labs: ABO, Rh: --/--/B POS (06/21 0356) Antibody: NEG (06/21 0356) Rubella:  immune RPR: NON REACTIVE (06/05 2222)  HBsAg:   neg HIV:   NR GBS:   pos  Assessment/Plan: IUP at 37+ weeks with SROM, AMA, obesity, h/o incompetent cervix and 2 preterm deliveries, GBS pos, desires BTL.  Has just received first dose of PCN for GBS pos, will augment with  pitocin and monitor progress.  Discussed logistics of BTL, she would like it done while she is in the hospital if possible.   Leighton Roach Debanhi Blaker 01/07/2021, 6:04 AM

## 2021-01-07 NOTE — MAU Note (Signed)
Pt reports she woke up and her bed was wet around 107am. Clear fluid out. Good fetal movement . Not sure if she is having ctx. . Having pain from her sciatica

## 2021-01-07 NOTE — Anesthesia Procedure Notes (Signed)
Epidural Patient location during procedure: OB Start time: 01/07/2021 8:46 AM End time: 01/07/2021 9:09 AM  Staffing Anesthesiologist: Trevor Iha, MD Performed: anesthesiologist   Preanesthetic Checklist Completed: patient identified, IV checked, site marked, risks and benefits discussed, surgical consent, monitors and equipment checked, pre-op evaluation and timeout performed  Epidural Patient position: sitting Prep: DuraPrep and site prepped and draped Patient monitoring: continuous pulse ox and blood pressure Approach: midline Location: L3-L4 Injection technique: LOR air  Needle:  Needle type: Tuohy  Needle gauge: 17 G Needle length: 9 cm and 9 Needle insertion depth: 9 cm Catheter type: closed end flexible Catheter size: 19 Gauge Catheter at skin depth: 15 cm Test dose: negative  Assessment Events: blood not aspirated, injection not painful, no injection resistance, no paresthesia and negative IV test  Additional Notes Patient identified. Risks/Benefits/Options discussed with patient including but not limited to bleeding, infection, nerve damage, paralysis, failed block, incomplete pain control, headache, blood pressure changes, nausea, vomiting, reactions to medication both or allergic, itching and postpartum back pain. Confirmed with bedside nurse the patient's most recent platelet count. Confirmed with patient that they are not currently taking any anticoagulation, have any bleeding history or any family history of bleeding disorders. Patient expressed understanding and wished to proceed. All questions were answered. Sterile technique was used throughout the entire procedure. Please see nursing notes for vital signs. Test dose was given through epidural needle and negative prior to continuing to dose epidural or start infusion. Warning signs of high block given to the patient including shortness of breath, tingling/numbness in hands, complete motor block, or any  concerning symptoms with instructions to call for help. Patient was given instructions on fall risk and not to get out of bed. All questions and concerns addressed with instructions to call with any issues.  2 Attempt (S) . Patient tolerated procedure well.

## 2021-01-07 NOTE — Lactation Note (Signed)
This note was copied from a baby's chart. Lactation Consultation Note  Patient Name: Victoria [name] [id number] Date: 01/07/2021 Reason for consult: L&D Initial assessment;Early term 37-38.6wks;Other (Comment) (AMA) Age:41 hours  Visited with mom of 1 hours old ETI female, she's a P2 and reported moderate breast changes during the pregnancy. She had multiple losses, and has one child at home, he's now 52.  LC assisted with hand expression and latching, mom has semi-flat nipples and her tissue is not very compressible; but she was able to get colostrum, praised her for her efforts.  L&D Denny Peon had already assisted with latch earlier, but baby keeps slipping off the breast as soon as LC stopped doing compressions, she was very sleepy and fed for about 7-8 minutes; parents were very appreciative.  Reviewed normal newborn behavior, cluster feeding, feeding cues, size of baby's stomach and lactogenesis II.  Feeding plan:  Encouraged mom to feed baby STS 8-12 times/24 hours or sooner if feeding cues are present Hand expression and finger/spoon feeding were also encouraged  No literature provided due to the nature of this L&D consultation. Parents reported all questions and concerns were answered, they're both aware of LC OP services and will call PRN.  Maternal Data Has patient been taught Hand Expression?: Yes Does the patient have breastfeeding experience prior to this delivery?: Yes How long did the patient breastfeed?: 2 months  Feeding Mother's Current Feeding Choice: Breast Milk  LATCH Score Latch: Repeated attempts needed to sustain latch, nipple held in mouth throughout feeding, stimulation needed to elicit sucking reflex.  Audible Swallowing: A few with stimulation  Type of Nipple: Flat  Comfort (Breast/Nipple): Soft / non-tender  Hold (Positioning): Assistance needed to correctly position infant at breast and maintain latch.  LATCH Score: 6   Lactation Tools  Discussed/Used    Interventions Interventions: Breast feeding basics reviewed;Assisted with latch;Skin to skin;Breast massage;Hand express;Breast compression;Adjust position  Discharge Pump: Personal (Medela DEBP at home) [facility] Program: No  Consult Status Consult Status: Follow-up Date: 01/08/21 Follow-up type: In-patient    Victoria [name] 01/07/2021, 2:20 PM

## 2021-01-07 NOTE — Progress Notes (Signed)
S/p epidural, comfortable. CE now anterior lip, 0 station, only intermittent pressure. Anticipate SVD, cat 1 tracing, TOCO q2-58m BP 120/63   Pulse 97   Temp 97.9 F (36.6 C) (Oral)   Resp 16   Ht 5\' 6"  (1.676 m)   Wt 105.2 kg   LMP 12/26/2019   SpO2 100%   BMI 37.45 kg/m

## 2021-01-07 NOTE — Progress Notes (Signed)
Pt feeling rectal pressure after epidural. CE 3/80/-1, forebag ruptured, cleared. Pitocin at 62mU/min, TOCO q25m, Cat 1 tracing. Continue to titrate, anticipate SVD

## 2021-01-07 NOTE — Lactation Note (Signed)
This note was copied from a baby's chart. Lactation Consultation Note  Patient Name: Victoria [name] Date: 01/07/2021 Reason for consult: Initial assessment;Mother's request;Difficult latch;Primapara;1st time breastfeeding;Early term 26-38.6wks Age: < 8hrs  LC assisted Mom latching with breast compression in a football hold. Infant high palate did some suck training and chin tug to get her to extend her tongue.   Mom flat nipples that evert with stimulation. Mom to use breast shells when not pumping, sleeping or nursing. Parts, assembly, usage and cleaning reviewed.  Plan 1To feed based on cues 8-12x in 24 hr period no more than 3 hrs without an attempt. Mom place infant STS and with breast compression look for swallows.        2. If unable to latch, Mom to offer EBM via spoon from hand expression.          3. Mom to pre pump manual 5-10 min before latching.          4. I and O sheet reviewed.           5 LC brochure of inpatient and outpatient services reviewed.   Maternal Data Has patient been taught Hand Expression?: Yes  Feeding Mother's Current Feeding Choice: Breast Milk  LATCH Score Latch: Repeated attempts needed to sustain latch, nipple held in mouth throughout feeding, stimulation needed to elicit sucking reflex.  Audible Swallowing: A few with stimulation  Type of Nipple: Flat (will evert with stimulation)  Comfort (Breast/Nipple): Soft / non-tender  Hold (Positioning): Assistance needed to correctly position infant at breast and maintain latch.  LATCH Score: 6   Lactation Tools Discussed/Used Tools: Pump;Flanges Flange Size: 24 Breast pump type: Manual Pump Education: Setup, frequency, and cleaning;Milk Storage Reason for Pumping: elongater her nipples Pumping frequency: pre pump 5-10 minutes before latching  Interventions Interventions: Breast feeding basics reviewed;Breast compression;Assisted with latch;Adjust position;Hand pump;Skin to  skin;Support pillows;Breast massage;Position options;Hand express;Expressed milk;Education;Pre-pump if needed;Shells  Discharge Pump: Personal  Consult Status Consult Status: Follow-up Date: 01/08/21 Follow-up type: In-patient    Teigen Parslow  Nicholson-Springer 01/07/2021, 10:32 PM

## 2021-01-07 NOTE — Anesthesia Postprocedure Evaluation (Signed)
Anesthesia Post Note  Patient: Victoria [name]  Procedure(s) Performed: POST PARTUM TUBAL LIGATION     Patient location during evaluation: PACU Anesthesia Type: Epidural Level of consciousness: oriented and awake and alert Pain management: pain level controlled Vital Signs Assessment: post-procedure vital signs reviewed and stable Respiratory status: spontaneous breathing, respiratory function stable and nonlabored ventilation Cardiovascular status: blood pressure returned to baseline and stable Postop Assessment: no headache, no backache, no apparent nausea or vomiting, epidural receding and patient able to bend at knees Anesthetic complications: no   No notable events documented.  Last Vitals:  Vitals:   01/07/21 1745 01/07/21 1801  BP: 114/64 121/69  Pulse: 88 89  Resp: 20 (!) 22  Temp:    SpO2: 100% 100%    Last Pain:  Vitals:   01/07/21 1801  TempSrc:   PainSc: 0-No pain   Pain Goal:                Epidural/Spinal Function Cutaneous sensation: Tingles (01/07/21 1801), Patient able to flex knees: Yes (01/07/21 1801), Patient able to lift hips off bed: No (01/07/21 1801), Back pain beyond tenderness at insertion site: No (01/07/21 1801), Progressively worsening motor and/or sensory loss: No (01/07/21 1801), Bowel and/or bladder incontinence post epidural: No (01/07/21 1801)  Orry Sigl A.

## 2021-01-07 NOTE — Anesthesia Preprocedure Evaluation (Signed)
Anesthesia Evaluation  Patient identified by MRN, date of birth, ID band Patient awake    Reviewed: Allergy & Precautions, NPO status , Patient's Chart, lab work & pertinent test results  Airway Mallampati: II  TM Distance: >3 FB Neck ROM: Full    Dental no notable dental hx. (+) Teeth Intact, Dental Advisory Given   Pulmonary neg pulmonary ROS,    Pulmonary exam normal breath sounds clear to auscultation       Cardiovascular Exercise Tolerance: Good Normal cardiovascular exam Rhythm:Regular Rate:Normal     Neuro/Psych negative neurological ROS  negative psych ROS   GI/Hepatic negative GI ROS, Neg liver ROS,   Endo/Other  negative endocrine ROS  Renal/GU negative Renal ROS     Musculoskeletal   Abdominal (+) + obese,   Peds  Hematology Lab Results      Component                Value               Date                      WBC                      6.7                 01/07/2021                HGB                      10.9 (L)            01/07/2021                HCT                      34.3 (L)            01/07/2021                MCV                      85.5                01/07/2021                PLT                      213                 01/07/2021              Anesthesia Other Findings   Reproductive/Obstetrics (+) Pregnancy                             Anesthesia Physical Anesthesia Plan  ASA: 3  Anesthesia Plan: Epidural   Post-op Pain Management:    Induction:   PONV Risk Score and Plan:   Airway Management Planned:   Additional Equipment:   Intra-op Plan:   Post-operative Plan:   Informed Consent: I have reviewed the patients History and Physical, chart, labs and discussed the procedure including the risks, benefits and alternatives for the proposed anesthesia with the patient or authorized representative who has indicated his/her understanding and acceptance.        Plan Discussed with:   Anesthesia Plan Comments: (37.3  Wk A0U0 for LEA)        Anesthesia Quick Evaluation

## 2021-01-07 NOTE — Transfer of Care (Signed)
Immediate Anesthesia Transfer of Care Note  Patient: Victoria [name]  Procedure(s) Performed: POST PARTUM TUBAL LIGATION  Patient Location: PACU  Anesthesia Type:Epidural  Level of Consciousness: awake, alert  and oriented  Airway & Oxygen Therapy: Patient Spontanous Breathing  Post-op Assessment: Report given to RN and Post -op Vital signs reviewed and stable  Post vital signs: Reviewed and stable  Last Vitals:  Vitals Value Taken Time  BP 109/62 01/07/21 1715  Temp    Pulse 94 01/07/21 1717  Resp 21 01/07/21 1717  SpO2 98 % 01/07/21 1717  Vitals shown include unvalidated device data.  Last Pain:  Vitals:   01/07/21 1400  TempSrc:   PainSc: 0-No pain         Complications: No notable events documented.

## 2021-01-07 NOTE — Progress Notes (Signed)
Risks and benefits of bilateral tubal ligation explained by Dr. Reina Fuse. Consents signed at this time by patient and physician.

## 2021-01-07 NOTE — Op Note (Signed)
Date: 01/07/21 Surgeon: Ellison Hughs, MD Anesthesia: Epidural by Dr Richardson Landry Procedure: Postpartum bilateral tubal ligation Preoperative diagnosis: Undesired fertility Postoperative diagnosis: Same as above  Indications: [name] Is a 16WV [id number] with satisfied fertility. Counseled on non-permanent contraception options as listed below. Patient elects for permanent method via ppBTL.  Consent:  Dicussed with patient R/B/A of bilateral tubal ligation. Alternatives includes LARCs. Discussed increased risk of ectopic pregnancy after procedure (1 in 3 pregnancies post-ligation can be ectopic) and that a pregnancy test should be taken if suspicion arises postpartum. Discussed risk of failure 1/100, backup method should be used for 2-4wks. Discussed R/B or surgical procedure including bleeding, infection, injury to surrounding organs. Patient agrees to blood products if needed. Discussed that BTL does not protect against STI and that barrier protection should be used. Patient understands and intends to comply.    Operative Procedure: Patient was taken to the operating room where epidural anesthesia was boosted and found to be adequate by allys clamp test.  She was then prepped and draped in the normal sterile fashion in the dorsal supine position. An appropriate time out was performed. . Attention was then turned to the abdomen where of infraumbilical incision was made after injection with quarter percent Marcaine approximate 2 cm in width. The fascia was then identified and grasped with Coker clamps and elevated. The fascia was then opened sharply with Mayo scissors and the peritoneal space entered bluntly. S-retractors were utilized to identify the fallopian tubes bilaterally and trace it to its fimbriated end.  The left tube was elevated out of the incision and a loop was create by using Bovie cautery to create a window in the mesosalpinx. Loop pedicle was then suture ligated via modified Pomeroy method  with 0-plain gut suture x2. Loop was then amputated with Metzenbaum scissors and passed off the field. Process was repeated on patient's right tube. A second look at each tube confirmed hemostasis.  All instruments and sponges were removed and the fascia closed with a running suture of 0-vicryl.  The skin was closed with 4-0 vicryl in a subcuticular stitch and Dermabond   All counts were correct and the patient taken to the recovery room in good condition.

## 2021-01-07 NOTE — Anesthesia Preprocedure Evaluation (Signed)
Anesthesia Evaluation  Patient identified by MRN, date of birth, ID band Patient awake    Reviewed: Allergy & Precautions, NPO status , Patient's Chart, lab work & pertinent test results  Airway Mallampati: II  TM Distance: >3 FB Neck ROM: Full    Dental no notable dental hx. (+) Teeth Intact, Dental Advisory Given   Pulmonary neg pulmonary ROS,    Pulmonary exam normal breath sounds clear to auscultation       Cardiovascular Exercise Tolerance: Good Normal cardiovascular exam Rhythm:Regular Rate:Normal     Neuro/Psych negative neurological ROS  negative psych ROS   GI/Hepatic negative GI ROS, Neg liver ROS,   Endo/Other  negative endocrine ROS  Renal/GU negative Renal ROS     Musculoskeletal   Abdominal (+) + obese,   Peds  Hematology Lab Results      Component                Value               Date                      WBC                      6.7                 01/07/2021                HGB                      10.9 (L)            01/07/2021                HCT                      34.3 (L)            01/07/2021                MCV                      85.5                01/07/2021                PLT                      213                 01/07/2021              Anesthesia Other Findings   Reproductive/Obstetrics Desires PPTL                             Anesthesia Physical  Anesthesia Plan  ASA: 3  Anesthesia Plan: Epidural   Post-op Pain Management:    Induction:   PONV Risk Score and Plan:   Airway Management Planned: Natural Airway  Additional Equipment:   Intra-op Plan:   Post-operative Plan:   Informed Consent: I have reviewed the patients History and Physical, chart, labs and discussed the procedure including the risks, benefits and alternatives for the proposed anesthesia with the patient or authorized representative who has indicated his/her understanding  and acceptance.       Plan Discussed with:   Anesthesia Plan  Comments: (Plan to use LE for PPTL)        Anesthesia Quick Evaluation

## 2021-01-08 LAB — CBC
HCT: 24.7 % — ABNORMAL LOW (ref 36.0–46.0)
Hemoglobin: 8.3 g/dL — ABNORMAL LOW (ref 12.0–15.0)
MCH: 28.3 pg (ref 26.0–34.0)
MCHC: 33.6 g/dL (ref 30.0–36.0)
MCV: 84.3 fL (ref 80.0–100.0)
Platelets: 173 10*3/uL (ref 150–400)
RBC: 2.93 MIL/uL — ABNORMAL LOW (ref 3.87–5.11)
RDW: 16.4 % — ABNORMAL HIGH (ref 11.5–15.5)
WBC: 11.2 10*3/uL — ABNORMAL HIGH (ref 4.0–10.5)
nRBC: 0 % (ref 0.0–0.2)

## 2021-01-08 NOTE — Lactation Note (Signed)
This note was copied from a baby's chart. Lactation Consultation Note  Patient Name: Victoria [name] [id number] Date: 01/08/2021 Reason for consult: Follow-up assessment;Early term 37-38.6wks;Infant weight loss;Other (Comment) (3 % weight loss/ attempted to visit dyad and per dad mom just got in the shower. LC mentioned she would check back .) Age:41 hours  Maternal Data    Feeding Mother's Current Feeding Choice: Breast Milk  LATCH Score                    Lactation Tools Discussed/Used    Interventions    Discharge    Consult Status Date: 01/08/21 Follow-up type: In-patient    Matilde Sprang Jairon Ripberger 01/08/2021, 11:44 AM

## 2021-01-08 NOTE — Lactation Note (Signed)
This note was copied from a baby's chart. Lactation Consultation Note  Patient Name: Victoria [name] [id number] Date: 01/08/2021 Reason for consult: Follow-up assessment;Early term 37-38.6wks;Infant weight loss;Other (Comment) (AMA) Age:41 hours  Visited with mom of 24 hours old ETI female, she's a P2 and BF her first child for 2 months, but that was 11 years ago. Mom called out for assistance due to difficult latch, baby hasn't been showing feeding cues and she's been very sleepy. She's at 3% weight loss.  LC assisted mom with hand expression and she was able to get colostrum out of her right breast, praised her for her efforts. LC showed parents how to finger feed baby but baby was gaggy and very spitty, she wouldn't even open her mouth when rubbing colostrum with gloved finger.  LC took baby STS to mother's right breast in cross cradle hold but baby would not latch, she didn't know any interest in the feeding, when LC tried to gently open her mouth, baby would bite on gloved finger. An attempted was documented on flowsheets.   Reviewed normal newborn behavior, feeding cues, cluster feeding, size of baby's stomach and pumping schedule. Parents also aware of the availability of donor milk in the unit. Unsure if mom's tissue is a good candidate for NS at this point (not very compressible) but with consistent pumping it might be.  Feeding plan:  Encouraged mom to continue putting baby to breast 8-12 times/24 hours or sooner if feeding cues are present She'll start double pumping every 3 hours for 15 minutes for increased stimulation Mom was also encouraged to finger/spoon feed baby whenever she has attempts  FOB present and supportive. Parents reported all questions and concerns were answered, they're both aware of LC OP services and will call PRN.  Maternal Data    Feeding Mother's Current Feeding Choice: Breast Milk  LATCH Score                    Lactation Tools  Discussed/Used Tools: Shells;Pump;Flanges Flange Size: 24 Breast pump type: Double-Electric Breast Pump;Manual Pump Education: Setup, frequency, and cleaning;Milk Storage Reason for Pumping: induction of lactation, difficult latch, ETI, semi-flat nipples Pumping frequency: q 3 hours  Interventions Interventions: Breast feeding basics reviewed;Assisted with latch;Skin to skin;Breast massage;Hand express;Breast compression;Reverse pressure;Adjust position;Shells;Hand pump;DEBP;Education  Discharge Pump: Personal  Consult Status Consult Status: Follow-up Date: 01/09/21 Follow-up type: In-patient    Victoria [name] 01/08/2021, 2:08 PM

## 2021-01-08 NOTE — Lactation Note (Signed)
This note was copied from a baby's chart. Lactation Consultation Note Baby 13 hrs old at time of consult. Stimulated baby to wake for feeding. Mom has short shaft nipples. Encouraged to pre-pump and use stimulation to evert nipples. Encouraged mom to wear shells today. Baby didn't have much interest in BF. Hand expression taught and spoon fed.  Newborn behavior, feeding habits, STS, I&O, supply and demand discussed. Encouraged to call for assistance or questions.  Patient Name: Victoria [name] [id number] Date: 01/08/2021 Reason for consult: Mother's request;Difficult latch;Early term 37-38.6wks Age:86 hours  Maternal Data Has patient been taught Hand Expression?: Yes  Feeding    LATCH Score Latch: Too sleepy or reluctant, no latch achieved, no sucking elicited.  Audible Swallowing: None  Type of Nipple: Everted at rest and after stimulation (very short shaft)  Comfort (Breast/Nipple): Soft / non-tender  Hold (Positioning): Full assist, staff holds infant at breast  LATCH Score: 4   Lactation Tools Discussed/Used Tools: Shells;Pump Flange Size: 24 Breast pump type: Manual Pump Education: Milk Storage Reason for Pumping: very short shaft  Interventions Interventions: Breast massage;Hand express;Shells;Pre-pump if needed;Breast compression;Adjust position;Hand pump;Expressed milk;Skin to skin;Assisted with latch;Position options;Support pillows;Breast feeding basics reviewed  Discharge    Consult Status Consult Status: Follow-up Date: 01/08/21 Follow-up type: In-patient    Charyl Dancer 01/08/2021, 3:41 AM

## 2021-01-08 NOTE — Progress Notes (Signed)
Foley catheter was still in when patient admit to [facility], but no LDA documented in the flowsheet.  Foley catheter discontinued at 2300.

## 2021-01-08 NOTE — Anesthesia Postprocedure Evaluation (Signed)
Anesthesia Post Note  Patient: Victoria [name]  Procedure(s) Performed: AN AD HOC LABOR EPIDURAL     Patient location during evaluation: Mother Baby Anesthesia Type: Epidural Level of consciousness: awake, oriented and awake and alert Pain management: satisfactory to patient (Tylenol added for cramping ) Vital Signs Assessment: post-procedure vital signs reviewed and stable Respiratory status: spontaneous breathing, respiratory function stable and nonlabored ventilation Cardiovascular status: stable Postop Assessment: adequate PO intake, able to ambulate, patient able to bend at knees, no headache, no apparent nausea or vomiting and no backache Anesthetic complications: no   No notable events documented.  Last Vitals:  Vitals:   01/08/21 0300 01/08/21 0500  BP: 129/75 121/69  Pulse: 90 89  Resp: 18 18  Temp: 36.5 C 36.9 C  SpO2: 97% 99%    Last Pain:  Vitals:   01/08/21 1200  TempSrc:   PainSc: 2    Pain Goal:                   Victoria [name]

## 2021-01-08 NOTE — Progress Notes (Signed)
Post Partum Day 1 Subjective: up ad lib, voiding, tolerating PO, + flatus, and lochia moderate. She reports some tenderness at incision site * BTL) and in abdomen as expected. She denies HA, SOB or CP. Bonding well with baby.Feels well  Objective: Blood pressure 121/69, pulse 89, temperature 98.4 F (36.9 C), temperature source Oral, resp. rate 18, height 5\' 6"  (1.676 m), weight 105.2 kg, last menstrual period 12/26/2019, SpO2 99 %, unknown if currently breastfeeding.  Physical Exam:  General: alert, cooperative, and no distress Lochia: appropriate Uterine Fundus: and abdomen mildly distended; tympanic Incision: no significant drainage DVT Evaluation: No evidence of DVT seen on physical exam. No significant calf/ankle edema.  Recent Labs    01/07/21 0356 01/08/21 0525  HGB 10.9* 8.3*  HCT 34.3* 24.7*    Assessment/Plan: Plan for discharge tomorrow and Breastfeeding Routine pp care Ordered maternity belt   LOS: 1 day   Jenefer Woerner W Kaliope Quinonez 01/08/2021, 1:58 PM

## 2021-01-09 LAB — SURGICAL PATHOLOGY

## 2021-01-09 MED ORDER — OXYCODONE HCL 5 MG PO TABS: 5.0000 mg | ORAL_TABLET | ORAL | 0 refills | Status: AC | PRN

## 2021-01-09 MED ORDER — IBUPROFEN 600 MG PO TABS: 600.0000 mg | ORAL_TABLET | Freq: Four times a day (QID) | ORAL | 0 refills | Status: AC

## 2021-01-09 NOTE — Discharge Summary (Signed)
Postpartum Discharge Summary       Patient Name: Victoria [name] DOB: 07-27-1980 MRN: [id number]  Date of admission: 01/07/2021 Delivery date:01/07/2021  Delivering provider: Carlisle Cater  Date of discharge: 01/09/2021  Admitting diagnosis: Normal labor and delivery [O80] Intrauterine pregnancy: [redacted]w[redacted]d     Secondary diagnosis:  Active Problems:   Normal labor and delivery  Additional problems: incompetent cervix, AMA, cervical prolapse    Discharge diagnosis: Term Pregnancy Delivered                                              Post partum procedures: none Augmentation: Pitocin Complications: None  Hospital course: Onset of Labor With Vaginal Delivery      40 y.o. yo [id number] at [redacted]w[redacted]d was admitted in Latent Labor on 01/07/2021. Patient had an uncomplicated labor course as follows:  Membrane Rupture Time/Date: 9:14 AM ,01/07/2021   Delivery Method:Vaginal, Spontaneous  Episiotomy: None  Lacerations:  1st degree  Patient had an uncomplicated postpartum course.  She is ambulating, tolerating a regular diet, passing flatus, and urinating well. Patient is discharged home in stable condition on 01/09/21.  Newborn Data: Birth date:01/07/2021  Birth time:1:20 PM  Gender:Female  Living status:Living  Apgars:8 ,9  Weight:3742 g   Magnesium Sulfate received: No BMZ received: Yes  June 5th and June 6th   Physical exam  Vitals:   01/08/21 0300 01/08/21 0500 01/08/21 2008 01/09/21 0517  BP: 129/75 121/69 (!) 104/59 115/69  Pulse: 90 89 96 (!) 101  Resp: 18 18 18 18   Temp: 97.7 F (36.5 C) 98.4 F (36.9 C) 98.6 F (37 C) 98.4 F (36.9 C)  TempSrc: Oral Oral Oral Oral  SpO2: 97% 99% 100% 100%  Weight:      Height:       General: alert and cooperative Lochia: appropriate Uterine Fundus: firm Incision: Dressing is clean, dry, and intact  Labs: Lab Results  Component Value Date   WBC 11.2 (H) 01/08/2021   HGB 8.3 (L) 01/08/2021   HCT 24.7 (L) 01/08/2021   MCV 84.3  01/08/2021   PLT 173 01/08/2021   CMP Latest Ref Rng & Units 04/03/2014  Glucose 70 - 99 mg/dL 04/05/2014)  BUN 6 - 23 mg/dL 11  Creatinine 0.4 - 1.2 mg/dL 0.7  Sodium 14(N - 829 mEq/L 138  Potassium 3.5 - 5.1 mEq/L 3.9  Chloride 96 - 112 mEq/L 103  CO2 19 - 32 mEq/L 25  Calcium 8.4 - 10.5 mg/dL 8.9  Total Protein 6.0 - 8.3 g/dL 562)  Total Bilirubin 0.2 - 1.2 mg/dL 0.9  Alkaline Phos 39 - 117 U/L 132(H)  AST 0 - 37 U/L 20  ALT 0 - 35 U/L 17   Edinburgh Score: Edinburgh Postnatal Depression Scale Screening Tool 01/08/2021  I have been able to laugh and see the funny side of things. 0  I have looked forward with enjoyment to things. 0  I have blamed myself unnecessarily when things went wrong. 1  I have been anxious or worried for no good reason. 1  I have felt scared or panicky for no good reason. 0  Things have been getting on top of me. 0  I have been so unhappy that I have had difficulty sleeping. 0  I have felt sad or miserable. 0  I have been so unhappy that I have  been crying. 0  The thought of harming myself has occurred to me. 0  Edinburgh Postnatal Depression Scale Total 2     After visit meds:  Allergies as of 01/09/2021   No Known Allergies      Medication List     STOP taking these medications    cyclobenzaprine 5 MG tablet Commonly known as: FLEXERIL       TAKE these medications    acetaminophen 500 MG tablet Commonly known as: TYLENOL Take 2 tablets (1,000 mg total) by mouth every 6 (six) hours as needed for moderate pain.   docusate sodium 100 MG capsule Commonly known as: Colace Take 1 capsule (100 mg total) by mouth 2 (two) times daily.   ferrous sulfate 325 (65 FE) MG tablet Take 325 mg by mouth daily with breakfast.   ibuprofen 600 MG tablet Commonly known as: ADVIL Take 1 tablet (600 mg total) by mouth every 6 (six) hours.   multivitamin-prenatal 27-0.8 MG Tabs tablet Take 1 tablet by mouth every evening.   oxyCODONE 5 MG immediate  release tablet Commonly known as: Oxy IR/ROXICODONE Take 1 tablet (5 mg total) by mouth every 4 (four) hours as needed (pain scale 4-7).         Discharge home in stable condition Infant Feeding: Breast Infant Disposition:home with mother Discharge instruction: per After Visit Summary and Postpartum booklet. Activity: Advance as tolerated. Pelvic rest for 6 weeks.  Diet: routine diet Future Appointments:No future appointments. Follow up Visit:  Follow-up Information     Shivaji, Valerie Roys, MD Follow up in 2 week(s).   Specialty: Obstetrics and Gynecology Why: Incision check Contact information: [address] [place] [address] [place] [address] [phone]                  Please schedule this patient for a In person postpartum visit in  2 weeks  with the following provider: MD.  Delivery mode:  Vaginal, Spontaneous  Anticipated Birth Control:  BTL done [facility]   01/09/2021 Oliver Pila, MD

## 2021-01-09 NOTE — Progress Notes (Signed)
Post Partum Day 2 Subjective: up ad lib, voiding, tolerating PO, and incision from tubal still very sore  Objective: Blood pressure 115/69, pulse (!) 101, temperature 98.4 F (36.9 C), temperature source Oral, resp. rate 18, height 5\' 6"  (1.676 m), weight 105.2 kg, last menstrual period 12/26/2019, SpO2 100 %, unknown if currently breastfeeding.  Physical Exam:  General: alert and cooperative Lochia: appropriate Uterine Fundus: firm Incision C/D/I  Recent Labs    01/07/21 0356 01/08/21 0525  HGB 10.9* 8.3*  HCT 34.3* 24.7*    Assessment/Plan: Discharge home Add prn oxycodone for incision pain F/u ofice 6 weeks   LOS: 2 days   01/10/21 01/09/2021, 9:14 AM

## 2021-01-09 NOTE — Lactation Note (Signed)
This note was copied from a baby's chart. Lactation Consultation Note  Patient Name: Victoria [name] [id number] Date: 01/09/2021 Reason for consult: Follow-up assessment;1st time breastfeeding;Primapara;Early term 37-38.6wks;Infant weight loss;Other (Comment) (8 % weight loss) Age:41 hours Per mom the baby is still not opening wide to latch over the nipple well and is due to feed.  LC offered to check the diaper / dry and assessed the tissue / short shaft nipples noted, more on the right than the left 1st tried latching on the right, challenging to obtain the depth.  Switched to the left/ cross cradle and baby latched with depth and fed for 25 mins with swallows / increased with moist heat.   LC provided a written BF D/C plan with steps for latching due to short shaft nipples.  Shells between feedings while awake Prior to latching - breast massage, hand express, pre - pump both breast with the DEBP for 3-4 mins and reverse pressure , latch with firn support.  If baby still hungry after the 1st breast offer the 2nd breast.  After baby settled post pump both breast 10 mins/ save milk to supplement back to baby.    Maternal Data Has patient been taught Hand Expression?: Yes  Feeding Mother's Current Feeding Choice: Breast Milk  LATCH Score Latch: Grasps breast easily, tongue down, lips flanged, rhythmical sucking.  Audible Swallowing: Spontaneous and intermittent  Type of Nipple: Everted at rest and after stimulation  Comfort (Breast/Nipple): Filling, red/small blisters or bruises, mild/mod discomfort  Hold (Positioning): Assistance needed to correctly position infant at breast and maintain latch.  LATCH Score: 8   Lactation Tools Discussed/Used Tools: Shells;Pump;Flanges Flange Size: 24;27 Breast pump type: Manual;Double-Electric Breast Pump Pump Education: Milk Storage  Interventions Interventions: Breast feeding basics reviewed;Assisted with latch;Skin to skin;Breast  massage;Hand express;Pre-pump if needed;Reverse pressure;Breast compression;Adjust position;Support pillows;Position options;Expressed milk;Shells;Hand pump;DEBP;Education  Discharge Discharge Education: Engorgement and breast care;Warning signs for feeding baby Pump: Manual;Personal;DEBP  Consult Status Consult Status: Complete Date: 01/09/21    Kathrin Greathouse 01/09/2021, 11:16 AM

## 2021-01-11 ENCOUNTER — Telehealth (HOSPITAL_COMMUNITY): Payer: Self-pay
